# Patient Record
Sex: Female | Born: 1965 | Race: White | Hispanic: No | Marital: Married | Smoking: Never smoker
Health system: Southern US, Community
[De-identification: ages and names within clinical notes are randomized; demographics above are authoritative.]

## PROBLEM LIST (undated history)

## (undated) DIAGNOSIS — H04123 Dry eye syndrome of bilateral lacrimal glands: Secondary | ICD-10-CM

## (undated) DIAGNOSIS — R51 Headache: Secondary | ICD-10-CM

## (undated) DIAGNOSIS — Z973 Presence of spectacles and contact lenses: Secondary | ICD-10-CM

## (undated) DIAGNOSIS — Z8669 Personal history of other diseases of the nervous system and sense organs: Secondary | ICD-10-CM

## (undated) DIAGNOSIS — L719 Rosacea, unspecified: Secondary | ICD-10-CM

## (undated) HISTORY — PX: AUGMENTATION MAMMAPLASTY: SUR837

## (undated) HISTORY — PX: ABDOMINOPLASTY: SHX5355

## (undated) HISTORY — PX: ABDOMINAL HYSTERECTOMY: SHX81

## (undated) HISTORY — PX: OTHER SURGICAL HISTORY: SHX169

## (undated) HISTORY — PX: TOTAL ABDOMINAL HYSTERECTOMY: SHX209

## (undated) HISTORY — PX: CHOLECYSTECTOMY: SHX55

## (undated) HISTORY — PX: BREAST BIOPSY: SHX20

## (undated) HISTORY — PX: BREAST ENHANCEMENT SURGERY: SHX7

## (undated) HISTORY — PX: LAPAROSCOPIC CHOLECYSTECTOMY: SUR755

## (undated) HISTORY — PX: REDUCTION MAMMAPLASTY: SUR839

---

## 2002-04-22 ENCOUNTER — Encounter: Payer: Self-pay | Admitting: Obstetrics and Gynecology

## 2002-04-22 ENCOUNTER — Ambulatory Visit (HOSPITAL_COMMUNITY): Admission: RE | Admit: 2002-04-22 | Discharge: 2002-04-22 | Payer: Self-pay | Admitting: Obstetrics and Gynecology

## 2002-06-01 ENCOUNTER — Encounter (INDEPENDENT_AMBULATORY_CARE_PROVIDER_SITE_OTHER): Payer: Self-pay | Admitting: Specialist

## 2002-06-01 ENCOUNTER — Inpatient Hospital Stay (HOSPITAL_COMMUNITY): Admission: RE | Admit: 2002-06-01 | Discharge: 2002-06-03 | Payer: Self-pay | Admitting: Obstetrics and Gynecology

## 2003-02-22 ENCOUNTER — Other Ambulatory Visit: Admission: RE | Admit: 2003-02-22 | Discharge: 2003-02-22 | Payer: Self-pay | Admitting: Obstetrics and Gynecology

## 2004-03-26 ENCOUNTER — Encounter: Admission: RE | Admit: 2004-03-26 | Discharge: 2004-03-26 | Payer: Self-pay | Admitting: Obstetrics and Gynecology

## 2004-04-02 ENCOUNTER — Other Ambulatory Visit: Admission: RE | Admit: 2004-04-02 | Discharge: 2004-04-02 | Payer: Self-pay | Admitting: Obstetrics and Gynecology

## 2004-11-18 HISTORY — PX: AUGMENTATION MAMMAPLASTY: SUR837

## 2008-03-10 ENCOUNTER — Other Ambulatory Visit: Admission: RE | Admit: 2008-03-10 | Discharge: 2008-03-10 | Payer: Self-pay | Admitting: Obstetrics and Gynecology

## 2008-03-22 ENCOUNTER — Encounter: Admission: RE | Admit: 2008-03-22 | Discharge: 2008-03-22 | Payer: Self-pay | Admitting: Obstetrics and Gynecology

## 2008-04-05 ENCOUNTER — Encounter: Admission: RE | Admit: 2008-04-05 | Discharge: 2008-04-05 | Payer: Self-pay | Admitting: Obstetrics and Gynecology

## 2010-12-09 ENCOUNTER — Encounter: Payer: Self-pay | Admitting: Obstetrics and Gynecology

## 2011-04-05 NOTE — H&P (Signed)
[facility]  Patient:    Judy Chase, Judy Chase Visit Number: [id number] MRN: [id number]          Service Type: Attending:  Beather Arbour. Thomasena Edis, M.D. Dictated by:   Beather Arbour Thomasena Edis, M.D. Adm. 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  She has been seeing Dr. Gilford Silvius and was scheduled to have a hysterectomy, but her insurance will not cover this to be done at [facility].  She has had continuing right lower quadrant pain and had been found to have a complex left ovarian cyst. She had been scheduled for surgery by Dr. Gilford Silvius due to her significant menorrhagia.  Her cycles are every 30 days with a 7- to 10-day duration of flow.  She passes numerous clots such that she must change her pad every two hours.  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  She has often found that she soils her clothes extensive as she is unable to change her sanitary protection every one to two hours.  She has been tried on some norethindrone, but this caused nausea and she discontinued this.  I subsequently obtained Dr. Robina Ade records and obtained and ultrasound from [facility].  The patient is also complaining of persistent right lower quadrant pain as well. The right ovary was noted to be normal as was the left ovary.  The uterus was normal measuring 10 x 8 x 4.2 x 6.3 mm.  The endometrial stripe was normal measuring 7.5 mm.  A scar was seen along the lower uterine segment of the uterus consistent with her prior Cesarean section.  The patient was advised of the risks of surgery including anesthetic complication, hemorrhage infection, damage to adjacent structures including bladder, bowel, blood vessels  or ureter.  We will do a bowel prep.  I have also explained that she has increased risk to have damage to adjacent structures due to her previous surgery.  She absolutely refuses to take oral contraceptives stating that this would only temporize the issue as she does not want to take these forever and she is only 45 years old and will probably not become menopausal for 15-20 years.  PAST OBSTETRIC/GYNECOLOGIC HISTORY:  Menarche at age 29.  Contraception with tubal ligation.  The patient does have a history of an abnormal Pap smear years ago, but her most recent Pap smears have been normal.  PAST MEDICAL HISTORY: 1. Migraine headaches. 2. Menorrhagia. 3. The patient says she occasionally can have a low blood sugar, but does not    have diabetes or any glucose intolerance.  ALLERGIES:  No known drug allergies.  MEDICATIONS:  None.  PAST SURGICAL HISTORY: 1. Cesarean section for twins on October 19, 1984. 2. Laparoscopic cholecystectomy in April 1992. 3. Tubal ligation on August 13, 1990. 4. Spontaneous vaginal delivery x2 with one in 1982, and another in 1991.  FAMILY HISTORY:  There is no family history of colon, breast or prostate cancer.  The patients maternal first cousin had ovarian cancer.  The patients mother is 49, alive and well.  She did undergo a  hysterectomy.  Her father is 38 with adult-onset diabetes mellitus.  One sister, age 88, with cervical cancer.  Another sister, age 25, with rheumatoid arthritis.  Four children ages 61 to 32, alive and well.  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 Tobacco none.  Alcohol none.  REVIEW OF SYSTEMS:  Noncontributory except as noted above.  Denies headache, visual changes, chest pain, shortness of breath, abdominal pain, change in bowel habits, unintentional weight loss, dysuria, urgency, frequency, vaginal pruritus or discharge, pain or bleeding with intercourse.  PHYSICAL  EXAMINATION:  GENERAL:  Well-developed, Caucasian female with blood pressure 108/70, heart rate 72, height 5 feet 7 inches, weight 148 pounds.  HEENT:  Normal.  NECK:  Supple without thyromegaly, adenopathy or nodules.  CHEST:  Clear to auscultation.  BREASTS:  Symmetrical without masses, nodes, dimpling, retraction or nipple discharge.  CARDIAC:  Regular rate and rhythm without extra sounds or murmurs.  ABDOMEN:  Soft, nontender, no hepatosplenomegaly or masses.  I am able to palpate the uterus approximately 2-3 cm above the pubic symphysis.  EXTREMITIES:  No clubbing, cyanosis, or edema.  NEUROLOGIC:  Alert and oriented x3.  Grossly normal.  PELVIC:  Normal external female genitalia.  No vulvar, vaginal or cervical lesions.  Pap smear was not performed at her initial visit because it was normal at Dr. Robina Ade office on December 17, 2001.  The uterus is nontender. There is no adnexal masses palpated to be approximately 14 weeks size, but this was due to the elevation of the uterus up into the pelvis.  Rectal confirmatory with no masses.  ASSESSMENT/PLAN:  The patient is a 45 year old female with significant menorrhagia such that she bleeds 7-10 days each month.  Thyroid-stimulating hormone is normal and hemoglobin is normal as well.  Her pelvic ultrasound is normal.  I suspect the patient likely as adenomyosis.  She was tried on a trial of norethindrone, but was unable to tolerate this due to nausea.  She desires definitive treatment despite the risks.  She states that she does not desire to take oral contraceptives as her husband has had a vasectomy and this would only temporize the issue as she will not become menopausal for 15-20 years and does not desire to take oral contraceptives for that length of time. I suspect that she may have adenomyosis.  She is having a lot of pain in her right lower quadrant and our agreement is that she may possibly undergo a right  salpingo-oophorectomy if the ovary and tube are adhesed which could be  the etiology of the pain.  She desires to keep her left ovary if possible, but if this looks diseased in any way, she does not mind if this is removed as well.  She does understand that if she has a BSO, she will become instantly menopausal and require hormone replacement therapy.  Again, I have discussed all the risks as noted above as well as the risk of vesicovaginal fistula. The patient expresses understanding of and accepts these risks and desires to proceed with surgery. Dictated by:   Beather Arbour Thomasena Edis, M.D. Attending:  Beather Arbour. Thomasena Edis, M.D. DD:  05/31/02 TD:  05/31/02 Job: [id number]

## 2011-04-05 NOTE — Op Note (Signed)
[facility]  Patient:    Judy Chase, Judy Chase Visit Number: [id number] MRN: [id number]          Service Type: GYN Location: 4W [id number] Attending Physician:  Madelyn Flavors Dictated by:   Beather Arbour Thomasena Edis, M.D. Proc. Date: 06/01/02 Admit Date:  06/01/2002 Discharge Date: 06/03/2002   CC:         Georgina Peer, M.D.   Operative Report  PREOPERATIVE DIAGNOSES:  Significant metrorrhagia and right adnexal pain.  POSTOPERATIVE DIAGNOSES:  Significant metrorrhagia and right adnexal pain.  PROCEDURE:  Total abdominal hysterectomy, right salpingo-oophorectomy, lysis of fascial adhesions.  SURGEON:  Beather Arbour. Thomasena Edis, M.D.  ASSISTANT:  Georgina Peer, M.D.  ANESTHESIA:  General endotracheal.  ESTIMATED BLOOD LOSS:  Less than 100 cc.  DRAINS:  Colon Foley.  FLUIDS:  2300 cc of crystalloid and 1 g of cefotetan IV preoperative antibiotic. Urine output 350 cc.  COMPLICATIONS:  None.  DRAINS:  Foley.  FINDINGS:  Normal appendix, right ovarian corpus luteum, no adhesions of right ovary or tube, normal left tube and ovary. Thick fascial adhesions caused by probable Tevdek or other similar permanent suture. No clear etiology found for the patients right lower quadrant pain.  DESCRIPTION OF PROCEDURE:  The patient was brought to the operating room, identified on the operating room table. After induction of adequate general endotracheal anesthesia, the patient was placed in the supine position and prepped and draped in the usual sterile fashion fashion, a Foley catheter was placed. The patients previous cesarean section scar was noted to be just above the symphysis pubis but it was decided that we could probably easily perform the hysterectomy through this existing incision. An incision was made through the previous incision and carried down to the fascia. The fascia was noted to have very dense thick adhesions and we encountered many sutures  of some type of permanent suture such as Tevdek. It was necessary to go ahead and remove all these sutures. They were quite imbedded in the patients fascia. It was thought if we did remove them that they would create a nidus of infection and also maybe might compromise the fascial closure. After a very tedious dissection through the fascia and taking down the fascia in the midline, the patient was placed in Trendelenburg and the peritoneum was entered very carefully taking care to avoid bowel or other abdominal contents. This was done sharply with a knife. The muscles were noted to be quite adhesed in the midline and I was able to take them down using a scalpel very carefully. Great care was taken to not compromise the bladder. The peritoneal surfaces were noted to be smooth. The OConnor-OSullivan retractor was placed and the bowel was packed out of the operative field after the bladder bladder was placed, the abdominal wall retractor was placed. The uterus was elevated up to the level of the uterine incision. The left tube and ovary were noted to be normal. The right tube and ovary were noted essentially normal but there was noted to be a quite sizeable luteum of the right ovary. However, because this is the area of pain which the patient encountered, we had decided unless it looked absolutely normal to go ahead and remove the right tube and ovary. Long Kellys were placed at either angle of the uterus and the round ligament on the right was suture ligated with figure-of-eight suture of #0 monocryl, divided with cautery and the anterior leaf of the broad ligaments divided to  the area overlying the internal os. The clear space was developed and the infundibulopelvic ligament was clamped with a curved Heaney, cut, tied with a free tie suture ligature of #0 Monocryl. The ureter had been on the identified on the right and was noted to be well away from this pedicle. The bladder was noted to be  very adhesed in the midline and was taken down using sharp dissection. The left round ligament was divided with cautery and the right edge was tied with a free tie of #0 Monocryl. The anterior leaf of the broad ligament was separated to the area overlying the internal os. The bladder was further taken down and I was ultimately able to find an excellent plain and we were able to take down the bladder well; however, this was a very careful and tedious dissection especially in the midline. The uterine vessels on the right were subsequently skeletonized, clamped with a curved Heaney and an additional Heaney for back bleeding, cut and suture ligated using a figure-of-eight suture of #0 Monocryl. This was accomplished on the left in a similar fashion. It should be noted that after taking down the round ligament on the left, the utero-ovarian ligament was clamped with a curved Heaney, cut, and tied with a free tie and then a suture ligature of #0 Monocryl thus preserving the left tube and ovary. After the bladder was well taken down, subsequent cardinal uterosacral ligaments bites were taken with straight Heaneys, cut and suture ligated using sutures of #0 Monocryl. Using curved Heaneys, the uterosacral ligaments were clamped bilaterally, cut and suture ligated using sutures of #0 Monocryl. Again these bites were noted to be well away from the ureter. Using a curved Heaney, the distalmost portion of the uterosacral ligament complex was clamped, cut and suture ligated using a figure-of-eight suture of #0 Monocryl as the vagina was entered with this bite. A curved Heaney was placed across the distal portion of the vagina on the left was cut and the vagina was entered. A figure-of-eight suture of #0 Monocryl was placed in the midline to completely close the vagina as the vagina had been almost completely closed with the angled sutures placed when then vagina was entered. The vaginal cuff was noted to  be completely closed and there was noted to be no oozing. A  suture of #0 Vicryl was used to plicate the uterosacral ligaments to provide support. The pelvis was then entirely irrigated copiously with normal saline filled with warm lactated Ringers and excellent hemostasis was achieved using cautery. There was noted to be one oozing edge across the left vaginal cuff and a figure-of-eight suture of #0 Monocryl was placed with excellent hemostasis noted. Again a very careful and thorough systematic inspection of all pedicles was conducted and there was noted to be no bleeding. All pedicles were noted to be hemostatic. There was noted to be a small amount of oozing over the bladder flap and excellent hemostasis achieved using cautery. After noting excellent hemostasis, the OConnor-OSullivan retractor as well as the abdominal wall and bladder retractors were removed and all packs were removed from the operative field. The patient was taken out of Trendelenburg and Kelly clamps were placed on the peritoneum. The peritoneum was closed using a simple running suture of 2-0 monocryl. The subfascial areas were examined after evidence of hemostasis and excellent hemostasis achieved using cautery. The subcutaneous tissue was noted to be extremely scarred and in fact Scarpas fascia had been taken down previously to mobilize the  fascia so that the fascia could be closed without significant tension due to the significant scarring of the fascia. Very careful fascial closure was then performed using two sutures of #0 PDS with each suture anchored to the angles and around to the midline and tied in a simple running fashion. Excellent fascial closure was assured. The fascia was noted to be much more mobile with mobilization of the Scarpas layer. The subcutaneous tissue was then irrigated copiously with warm lactated Ringers and excellent hemostasis achieved using cautery. The skin was closed with staples. The  patient tolerated the procedure well without apparent complications and was transferred to the recovery room in stable condition after all instrument, sponge, and needle counts were correct. Dictated by:   Beather Arbour Thomasena Edis, M.D. Attending Physician:  Madelyn Flavors DD:  06/01/02 TD:  06/04/02 Job: [id number]

## 2011-07-08 ENCOUNTER — Other Ambulatory Visit: Payer: Self-pay | Admitting: Obstetrics and Gynecology

## 2011-07-08 ENCOUNTER — Other Ambulatory Visit: Payer: Self-pay | Admitting: Obstetrics & Gynecology

## 2011-07-08 DIAGNOSIS — Z1231 Encounter for screening mammogram for malignant neoplasm of breast: Secondary | ICD-10-CM

## 2011-07-19 ENCOUNTER — Ambulatory Visit
Admission: RE | Admit: 2011-07-19 | Discharge: 2011-07-19 | Disposition: A | Discharge status: Home / Self Care | Payer: 59 | Source: Ambulatory Visit | Attending: Obstetrics & Gynecology | Admitting: Obstetrics & Gynecology

## 2011-07-19 DIAGNOSIS — Z1231 Encounter for screening mammogram for malignant neoplasm of breast: Secondary | ICD-10-CM

## 2013-04-16 ENCOUNTER — Other Ambulatory Visit: Payer: Self-pay

## 2013-04-16 DIAGNOSIS — Z1231 Encounter for screening mammogram for malignant neoplasm of breast: Secondary | ICD-10-CM

## 2013-05-14 ENCOUNTER — Ambulatory Visit
Admission: RE | Admit: 2013-05-14 | Discharge: 2013-05-14 | Disposition: A | Discharge status: Home / Self Care | Payer: 59 | Source: Ambulatory Visit

## 2013-05-14 DIAGNOSIS — Z1231 Encounter for screening mammogram for malignant neoplasm of breast: Secondary | ICD-10-CM

## 2013-05-18 ENCOUNTER — Other Ambulatory Visit: Payer: Self-pay | Admitting: Obstetrics & Gynecology

## 2013-05-18 DIAGNOSIS — R928 Other abnormal and inconclusive findings on diagnostic imaging of breast: Secondary | ICD-10-CM

## 2013-05-20 ENCOUNTER — Encounter (HOSPITAL_COMMUNITY): Payer: Self-pay | Admitting: Pharmacy Technician

## 2013-05-28 NOTE — Patient Instructions (Addendum)
HAWLEY PAVIA  05/28/2013   Your procedure is scheduled on:  06/04/13  Report to [facility] at 06:15 AM.  Call this number if you have problems the morning of surgery: [phone]   Remember:   Do not eat food or drink liquids after midnight.   Take these medicines the morning of surgery with A SIP OF WATER: None   Do not wear jewelry, make-up or nail polish.  Do not wear lotions, powders, or perfumes.   Do not shave 48 hours prior to surgery. Men may shave face and neck.  Do not bring valuables to the hospital.  [facility] is not responsible for any belongings or valuables.  Contacts, dentures or bridgework may not be worn into surgery.  Leave suitcase in the car. After surgery it may be brought to your room.  For patients admitted to the hospital, checkout time is 11:00 AM the day of discharge.   Patients discharged the day of surgery will not be allowed to drive home.    Special Instructions: Shower using CHG 2 nights before surgery and the night before surgery.  If you shower the day of surgery use CHG.  Use special wash - you have one bottle of CHG for all showers.  You should use approximately 1/3 of the bottle for each shower.   Please read over the following fact sheets that you were given: Pain Booklet, Surgical Site Infection Prevention, Anesthesia Post-op Instructions and Care and Recovery After Surgery    Hemorrhoidectomy Hemorrhoidectomy is surgery to remove hemorrhoids. Hemorrhoids are veins that have become swollen in the rectum. The rectum is the area from the bottom end of the intestines to the opening where bowel movements leave the body. Hemorrhoids can be uncomfortable. They can cause itching, bleeding and pain if a blood clot forms in them (thrombose). If hemorrhoids are small, surgery may not be needed. But if they cover a larger area, surgery is usually suggested.  LET YOUR CAREGIVER KNOW ABOUT:   Any allergies.  All medications you are taking,  including:  Herbs, eyedrops, over-the-counter medications and creams.  Blood thinners (anticoagulants), aspirin or other drugs that could affect blood clotting.  Use of steroids (by mouth or as creams).  Previous problems with anesthetics, including local anesthetics.  Possibility of pregnancy, if this applies.  Any history of blood clots.  Any history of bleeding or other blood problems.  Previous surgery.  Smoking history.  Other health problems. RISKS AND COMPLICATIONS All surgery carries some risk. However, hemorrhoid surgery usually goes smoothly. Possible complications could include:  Urinary retention.  Bleeding.  Infection.  A painful incision.  A reaction to the anesthesia (this is not common). BEFORE THE PROCEDURE   Stop using aspirin and non-steroidal anti-inflammatory drugs (NSAIDs) for pain relief. This includes prescription drugs and over-the-counter drugs such as ibuprofen and naproxen. Also stop taking vitamin E. If possible, do this two weeks before your surgery.  If you take blood-thinners, ask your healthcare provider when you should stop taking them.  You will probably have blood and urine tests done several days before your surgery.  Do not eat or drink for about 8 hours before the surgery.  Arrive at least an hour before the surgery, or whenever your surgeon recommends. This will give you time to check in and fill out any needed paperwork.  Hemorrhoidectomy is often an outpatient procedure. This means you will be able to go home the same day. Sometimes, though, people stay overnight in the  hospital after the procedure. Ask your surgeon what to expect. Either way, make arrangements in advance for someone to drive you home. PROCEDURE   The preparation:  You will change into a hospital gown.  You will be given an IV. A needle will be inserted in your arm. Medication can flow directly into your body through this needle.  You might be given an  enema to clear your rectum.  Once in the operating room, you will probably lie on your side or be repositioned later to lying on your stomach.  You will be given anesthesia (medication) so you will not feel anything during the surgery. The surgery often is done with local anesthesia (the area near the hemorrhoids will be numb and you will be drowsy but awake). Sometimes, general anesthesia is used (you will be asleep during the procedure).  The procedure:  There are a few different procedures for hemorrhoids. Be sure to ask you surgeon about the procedure, the risks and benefits.  Be sure to ask about what you need to do to take care of the wound, if there is one. AFTER THE PROCEDURE  You will stay in a recovery area until the anesthesia has worn off. Your blood pressure and pulse will be checked every so often.  You may feel a lot of pain in the area of the rectum.  Take all pain medication prescribed by your surgeon. Ask before taking any over-the-counter pain medicines.  Sometimes sitting in a warm bath can help relieve your pain.  To make sure you have bowel movements without straining:  You will probably need to take stool softeners (usually a pill) for a few days.  You should drink 8 to 10 glasses of water each day.  Your activity will be restricted for awhile. Ask your caregiver for a list of what you should and should not do while you recover. Document Released: 09/01/2009 Document Revised: 01/27/2012 Document Reviewed: 09/01/2009 [facility] Patient Information 2014 [place], [place].    PATIENT INSTRUCTIONS POST-ANESTHESIA  IMMEDIATELY FOLLOWING SURGERY:  Do not drive or operate machinery for the first twenty four hours after surgery.  Do not make any important decisions for twenty four hours after surgery or while taking narcotic pain medications or sedatives.  If you develop intractable nausea and vomiting or a severe headache please notify your doctor  immediately.  FOLLOW-UP:  Please make an appointment with your surgeon as instructed. You do not need to follow up with anesthesia unless specifically instructed to do so.  WOUND CARE INSTRUCTIONS (if applicable):  Keep a dry clean dressing on the anesthesia/puncture wound site if there is drainage.  Once the wound has quit draining you may leave it open to air.  Generally you should leave the bandage intact for twenty four hours unless there is drainage.  If the epidural site drains for more than 36-48 hours please call the anesthesia department.  QUESTIONS?:  Please feel free to call your physician or the hospital operator if you have any questions, and they will be happy to assist you.

## 2013-05-31 ENCOUNTER — Encounter (HOSPITAL_COMMUNITY)
Admission: RE | Admit: 2013-05-31 | Discharge: 2013-05-31 | Disposition: A | Discharge status: Home / Self Care | Payer: 59 | Source: Ambulatory Visit | Attending: General Surgery | Admitting: General Surgery

## 2013-05-31 ENCOUNTER — Encounter (HOSPITAL_COMMUNITY): Payer: Self-pay

## 2013-05-31 HISTORY — DX: Headache: R51

## 2013-05-31 LAB — BASIC METABOLIC PANEL
CO2: 27 mEq/L (ref 19–32)
Calcium: 9.7 mg/dL (ref 8.4–10.5)
Creatinine, Ser: 0.89 mg/dL (ref 0.50–1.10)
Glucose, Bld: 113 mg/dL — ABNORMAL HIGH (ref 70–99)

## 2013-05-31 LAB — CBC WITH DIFFERENTIAL/PLATELET
Basophils Absolute: 0 10*3/uL (ref 0.0–0.1)
Eosinophils Absolute: 0.1 10*3/uL (ref 0.0–0.7)
Eosinophils Relative: 1 % (ref 0–5)
MCH: 30 pg (ref 26.0–34.0)
MCHC: 33.1 g/dL (ref 30.0–36.0)
MCV: 90.8 fL (ref 78.0–100.0)
Platelets: 268 10*3/uL (ref 150–400)
RDW: 14 % (ref 11.5–15.5)
WBC: 8.1 10*3/uL (ref 4.0–10.5)

## 2013-05-31 NOTE — H&P (Signed)
  NTS SOAP Note  Vital Signs:  Vitals as of: 05/18/2013: Systolic 145: Diastolic 78: Heart Rate 62: Temp 98.72F: Height 40ft 6.5in: Weight 149Lbs 0 Ounces: BMI 23.69  BMI : 23.69 kg/m2  Subjective: This 38 Years 60 Months old Female presents for of  increasing perirectal pain and intermittent bleeding. Patient states she's had a long-standing history of hemorrhoids. Over the last 20 years she's been able to control her symptoms with Preparation H. Over the last several months the discomfort has increased. She now states that they are frequently prolapsed and has to manually reduce them. She has not trialed any steroid creams otherwise she has been on stool softeners, fiber, water, sitz baths. No history of colonoscopy although she will undergo this next year. States mostly has discomfort with occasional intermittent bleeding with the flareups to  Review of Symptoms:  Constitutional:unremarkable   Head:unremarkable    Eyes:unremarkable   Nose/Mouth/Throat:unremarkable Cardiovascular:  unremarkable   Respiratory:unremarkable        as per history of present illness Genitourinary:unremarkable     Musculoskeletal:unremarkable   Skin:unremarkable Breast:unremarkable   Hematolgic/Lymphatic:unremarkable     Allergic/Immunologic:unremarkable     Past Medical History:  Obtained     Past Medical History  Pregnancy Gravida: 3 Pregnancy Para: 3 Surgical History: C-section, cholecystectomy, abdominoplasty, bilateral breast implants Medical Problems: none Psychiatric History: known Allergies: no known drug allergies Medications: none   Social History:Obtained  Social History  Preferred Language: English Race:  White Ethnicity: Not Hispanic / Latino Age: 47 Years 3 Months Marital Status:  S Alcohol: occasional Recreational drug(s): none   Smoking Status: Never smoker reviewed on 05/19/2013 Functional Status reviewed on  mm/dd/yyyy ------------------------------------------------ Bathing: Normal Cooking: Normal Dressing: Normal Driving: Normal Eating: Normal Managing Meds: Normal Oral Care: Normal Shopping: Normal Toileting: Normal Transferring: Normal Walking: Normal Cognitive Status reviewed on mm/dd/yyyy ------------------------------------------------ Attention: Normal Decision Making: Normal Language: Normal Memory: Normal Motor: Normal Perception: Normal Problem Solving: Normal Visual and Spatial: Normal   Family History:Obtained    Family Health History Mother  Father  Other Family Member, Living; Healthy; diabetes    Objective Information: General:  Well appearing, well nourished in no distress. Skin:     no rash or prominent lesions Head:Atraumatic; no masses; no abnormalities Eyes:  conjunctiva clear, EOM intact, PERRL Mouth:  Mucous membranes moist, no mucosal lesions. Neck:  Supple without lymphadenopathy.  Heart:  RRR, no murmur Lungs:    CTA bilaterally, no wheezes, rhonchi, rales.  Breathing unlabored. Abdomen:Soft, NT/ND, no HSM, no masses.     2 prominent hemorrhoidal columns. No bleeding. Minimal discomfort.  Assessment:    Plan:  Prolapsing hemorrhoids. Options were discussed at length the patient. At this time she is failing on multiple conservative treatment options. Patient does wish to proceed and we'll schedule her convenience.  Patient Education:Alternative treatments to surgery were discussed with patient (and family).  Risks and benefits  of procedure were fully explained to the patient (and family) who gave informed consent. Patient/family questions were addressed.  Follow-up:Pending Surgery

## 2013-06-04 ENCOUNTER — Ambulatory Visit (HOSPITAL_COMMUNITY): Payer: 59 | Admitting: Anesthesiology

## 2013-06-04 ENCOUNTER — Encounter (HOSPITAL_COMMUNITY): Payer: Self-pay | Admitting: *Deleted

## 2013-06-04 ENCOUNTER — Ambulatory Visit (HOSPITAL_COMMUNITY)
Admission: RE | Admit: 2013-06-04 | Discharge: 2013-06-04 | Disposition: A | Discharge status: Home / Self Care | Payer: 59 | Source: Ambulatory Visit | Attending: General Surgery | Admitting: General Surgery

## 2013-06-04 ENCOUNTER — Encounter (HOSPITAL_COMMUNITY): Payer: Self-pay | Admitting: Anesthesiology

## 2013-06-04 ENCOUNTER — Encounter (HOSPITAL_COMMUNITY)
Admission: RE | Disposition: A | Discharge status: Home / Self Care | Payer: Self-pay | Source: Ambulatory Visit | Attending: General Surgery

## 2013-06-04 DIAGNOSIS — K648 Other hemorrhoids: Secondary | ICD-10-CM | POA: Insufficient documentation

## 2013-06-04 HISTORY — PX: HEMORRHOID SURGERY: SHX153

## 2013-06-04 SURGERY — HEMORRHOIDECTOMY
Anesthesia: General | Wound class: Clean Contaminated

## 2013-06-04 MED ORDER — LIDOCAINE VISCOUS 2 % MT SOLN
OROMUCOSAL | Status: DC | PRN
  Administered 2013-06-04: 15 mL

## 2013-06-04 MED ORDER — MIDAZOLAM HCL 5 MG/5ML IJ SOLN
INTRAMUSCULAR | Status: DC | PRN
  Administered 2013-06-04: 2 mg via INTRAVENOUS

## 2013-06-04 MED ORDER — CELECOXIB 100 MG PO CAPS
ORAL_CAPSULE | ORAL | Status: AC
  Filled 2013-06-04: qty 4

## 2013-06-04 MED ORDER — ONDANSETRON HCL 4 MG/2ML IJ SOLN
4.0000 mg | Freq: Once | INTRAMUSCULAR | Status: AC
  Administered 2013-06-04: 4 mg via INTRAVENOUS

## 2013-06-04 MED ORDER — BUPIVACAINE HCL (PF) 0.5 % IJ SOLN
INTRAMUSCULAR | Status: AC
  Filled 2013-06-04: qty 30

## 2013-06-04 MED ORDER — PROPOFOL 10 MG/ML IV EMUL
INTRAVENOUS | Status: AC
  Filled 2013-06-04: qty 20

## 2013-06-04 MED ORDER — PROPOFOL 10 MG/ML IV BOLUS
INTRAVENOUS | Status: DC | PRN
  Administered 2013-06-04: 150 mg via INTRAVENOUS

## 2013-06-04 MED ORDER — LIDOCAINE HCL (PF) 1 % IJ SOLN
INTRAMUSCULAR | Status: AC
  Filled 2013-06-04: qty 5

## 2013-06-04 MED ORDER — ONDANSETRON HCL 4 MG/2ML IJ SOLN: 4.0000 mg | Freq: Once | INTRAMUSCULAR | Status: DC | PRN

## 2013-06-04 MED ORDER — LACTATED RINGERS IV SOLN
INTRAVENOUS | Status: DC
  Administered 2013-06-04: 1000 mL via INTRAVENOUS

## 2013-06-04 MED ORDER — BUPIVACAINE HCL (PF) 0.5 % IJ SOLN
INTRAMUSCULAR | Status: DC | PRN
  Administered 2013-06-04: 10 mL

## 2013-06-04 MED ORDER — FENTANYL CITRATE 0.05 MG/ML IJ SOLN
25.0000 ug | INTRAMUSCULAR | Status: AC
  Administered 2013-06-04: 25 ug via INTRAVENOUS

## 2013-06-04 MED ORDER — FENTANYL CITRATE 0.05 MG/ML IJ SOLN
INTRAMUSCULAR | Status: DC | PRN
  Administered 2013-06-04: 75 ug via INTRAVENOUS
  Administered 2013-06-04: 25 ug via INTRAVENOUS
  Administered 2013-06-04: 50 ug via INTRAVENOUS
  Administered 2013-06-04: 25 ug via INTRAVENOUS

## 2013-06-04 MED ORDER — FENTANYL CITRATE 0.05 MG/ML IJ SOLN
INTRAMUSCULAR | Status: AC
  Filled 2013-06-04: qty 2

## 2013-06-04 MED ORDER — ONDANSETRON HCL 4 MG/2ML IJ SOLN
INTRAMUSCULAR | Status: AC
  Filled 2013-06-04: qty 2

## 2013-06-04 MED ORDER — 0.9 % SODIUM CHLORIDE (POUR BTL) OPTIME
TOPICAL | Status: DC | PRN
  Administered 2013-06-04: 1000 mL

## 2013-06-04 MED ORDER — MIDAZOLAM HCL 2 MG/2ML IJ SOLN
1.0000 mg | INTRAMUSCULAR | Status: DC | PRN
  Administered 2013-06-04: 2 mg via INTRAVENOUS

## 2013-06-04 MED ORDER — FENTANYL CITRATE 0.05 MG/ML IJ SOLN: 25.0000 ug | INTRAMUSCULAR | Status: DC | PRN

## 2013-06-04 MED ORDER — HYDROCODONE-ACETAMINOPHEN 5-325 MG PO TABS: 1.0000 | ORAL_TABLET | ORAL | Status: DC | PRN

## 2013-06-04 MED ORDER — DEXTROSE 5 % IV SOLN
2.0000 g | INTRAVENOUS | Status: DC | PRN
  Administered 2013-06-04: 2 g via INTRAVENOUS

## 2013-06-04 MED ORDER — CELECOXIB 100 MG PO CAPS
400.0000 mg | ORAL_CAPSULE | Freq: Every day | ORAL | Status: AC
  Administered 2013-06-04: 400 mg via ORAL

## 2013-06-04 MED ORDER — DEXTROSE 5 % IV SOLN
INTRAVENOUS | Status: AC
  Filled 2013-06-04: qty 2

## 2013-06-04 MED ORDER — MIDAZOLAM HCL 2 MG/2ML IJ SOLN
INTRAMUSCULAR | Status: AC
  Filled 2013-06-04: qty 2

## 2013-06-04 MED ORDER — LIDOCAINE VISCOUS 2 % MT SOLN
OROMUCOSAL | Status: AC
  Filled 2013-06-04: qty 15

## 2013-06-04 MED ORDER — SURGILUBE EX GEL
CUTANEOUS | Status: DC | PRN
  Administered 2013-06-04: 1 via TOPICAL

## 2013-06-04 MED ORDER — DEXTROSE 5 % IV SOLN: 2.0000 g | INTRAVENOUS | Status: DC

## 2013-06-04 MED ORDER — LIDOCAINE HCL 1 % IJ SOLN
INTRAMUSCULAR | Status: DC | PRN
  Administered 2013-06-04: 50 mg via INTRADERMAL

## 2013-06-04 SURGICAL SUPPLY — 30 items
BAG HAMPER (MISCELLANEOUS) ×2 IMPLANT
CLOTH BEACON ORANGE TIMEOUT ST (SAFETY) ×2 IMPLANT
COVER LIGHT HANDLE STERIS (MISCELLANEOUS) ×4 IMPLANT
COVER MAYO STAND XLG (DRAPE) ×2 IMPLANT
DECANTER SPIKE VIAL GLASS SM (MISCELLANEOUS) ×2 IMPLANT
DRAPE PROXIMA HALF (DRAPES) ×2 IMPLANT
ELECT REM PT RETURN 9FT ADLT (ELECTROSURGICAL) ×2
ELECTRODE REM PT RTRN 9FT ADLT (ELECTROSURGICAL) ×1 IMPLANT
FORMALIN 10 PREFIL 120ML (MISCELLANEOUS) ×2 IMPLANT
GLOVE BIOGEL PI IND STRL 7.0 (GLOVE) ×3 IMPLANT
GLOVE BIOGEL PI IND STRL 7.5 (GLOVE) ×1 IMPLANT
GLOVE BIOGEL PI INDICATOR 7.0 (GLOVE) ×3
GLOVE BIOGEL PI INDICATOR 7.5 (GLOVE) ×1
GLOVE ECLIPSE 7.0 STRL STRAW (GLOVE) ×2 IMPLANT
GLOVE SS BIOGEL STRL SZ 6.5 (GLOVE) ×1 IMPLANT
GLOVE SUPERSENSE BIOGEL SZ 6.5 (GLOVE) ×1
GOWN STRL REIN XL XLG (GOWN DISPOSABLE) ×4 IMPLANT
HEMOSTAT SURGICEL 4X8 (HEMOSTASIS) ×2 IMPLANT
KIT ROOM TURNOVER APOR (KITS) ×2 IMPLANT
MANIFOLD NEPTUNE II (INSTRUMENTS) ×2 IMPLANT
NEEDLE HYPO 25X1 1.5 SAFETY (NEEDLE) ×2 IMPLANT
NS IRRIG 1000ML POUR BTL (IV SOLUTION) ×2 IMPLANT
PACK PERI GYN (CUSTOM PROCEDURE TRAY) ×2 IMPLANT
PAD ARMBOARD 7.5X6 YLW CONV (MISCELLANEOUS) ×2 IMPLANT
SET BASIN LINEN APH (SET/KITS/TRAYS/PACK) ×2 IMPLANT
SPONGE GAUZE 4X4 12PLY (GAUZE/BANDAGES/DRESSINGS) ×2 IMPLANT
SUT CHROMIC 3 0 SH 27 (SUTURE) ×2 IMPLANT
SUT SILK 0 FSL (SUTURE) ×2 IMPLANT
SYR CONTROL 10ML LL (SYRINGE) ×2 IMPLANT
TAPE CLOTH SURG 4X10 WHT LF (GAUZE/BANDAGES/DRESSINGS) ×2 IMPLANT

## 2013-06-04 NOTE — Transfer of Care (Signed)
Immediate Anesthesia Transfer of Care Note  Patient: Judy Chase  Procedure(s) Performed: Procedure(s): HEMORRHOIDECTOMY (N/A)  Patient Location: PACU  Anesthesia Type:General  Level of Consciousness: awake and patient cooperative  Airway & Oxygen Therapy: Patient Spontanous Breathing and Patient connected to face mask oxygen  Post-op Assessment: Report given to PACU RN, Post -op Vital signs reviewed and stable and Patient moving all extremities  Post vital signs: Reviewed and stable  Complications: No apparent anesthesia complications

## 2013-06-04 NOTE — Interval H&P Note (Signed)
History and Physical Interval Note:  06/04/2013 7:46 AM  Judy Chase  has presented today for surgery, with the diagnosis of hemorrhoid  The various methods of treatment have been discussed with the patient and family. After consideration of risks, benefits and other options for treatment, the patient has consented to  Procedure(s): HEMORRHOIDECTOMY (N/A) as a surgical intervention .  The patient's history has been reviewed, patient examined, no change in status, stable for surgery.  I have reviewed the patient's chart and labs.  Questions were answered to the patient's satisfaction.     Atzin Buchta C

## 2013-06-04 NOTE — Anesthesia Procedure Notes (Signed)
Procedure Name: LMA Insertion Date/Time: 06/04/2013 7:56 AM Performed by: Despina Hidden Pre-anesthesia Checklist: Emergency Drugs available, Patient identified, Suction available and Patient being monitored Patient Re-evaluated:Patient Re-evaluated prior to inductionOxygen Delivery Method: Circle system utilized Preoxygenation: Pre-oxygenation with 100% oxygen Ventilation: Mask ventilation without difficulty LMA: LMA inserted LMA Size: 3.0 Grade View: Grade I Number of attempts: 1 Placement Confirmation: ETT inserted through vocal cords under direct vision,  breath sounds checked- equal and bilateral and positive ETCO2 Tube secured with: Tape Dental Injury: Teeth and Oropharynx as per pre-operative assessment

## 2013-06-04 NOTE — Anesthesia Preprocedure Evaluation (Signed)
Anesthesia Evaluation  Patient identified by MRN, date of birth, ID band Patient awake    Reviewed: Allergy & Precautions, H&P , NPO status , Patient's Chart, lab work & pertinent test results  History of Anesthesia Complications Negative for: history of anesthetic complications  Airway Mallampati: II      Dental  (+) Teeth Intact   Pulmonary neg pulmonary ROS,  breath sounds clear to auscultation        Cardiovascular negative cardio ROS  Rhythm:Regular Rate:Normal     Neuro/Psych  Headaches,    GI/Hepatic negative GI ROS,   Endo/Other    Renal/GU      Musculoskeletal   Abdominal   Peds  Hematology   Anesthesia Other Findings   Reproductive/Obstetrics                           Anesthesia Physical Anesthesia Plan  ASA: II  Anesthesia Plan: General   Post-op Pain Management:    Induction: Intravenous  Airway Management Planned: LMA  Additional Equipment:   Intra-op Plan:   Post-operative Plan: Extubation in OR  Informed Consent: I have reviewed the patients History and Physical, chart, labs and discussed the procedure including the risks, benefits and alternatives for the proposed anesthesia with the patient or authorized representative who has indicated his/her understanding and acceptance.     Plan Discussed with:   Anesthesia Plan Comments:         Anesthesia Quick Evaluation

## 2013-06-04 NOTE — Anesthesia Postprocedure Evaluation (Signed)
  Anesthesia Post-op Note  Patient: Judy Chase  Procedure(s) Performed: Procedure(s): HEMORRHOIDECTOMY (N/A)  Patient Location: PACU  Anesthesia Type:General  Level of Consciousness: awake, alert , oriented and patient cooperative  Airway and Oxygen Therapy: Patient Spontanous Breathing  Post-op Pain: 2 /10, mild  Post-op Assessment: Post-op Vital signs reviewed, Patient's Cardiovascular Status Stable, Respiratory Function Stable, Patent Airway, No signs of Nausea or vomiting and Pain level controlled  Post-op Vital Signs: Reviewed and stable  Complications: No apparent anesthesia complications

## 2013-06-04 NOTE — Op Note (Signed)
Patient:  Judy Chase  DOB:  1966/04/21  MRN:  [id number]   Preop Diagnosis:  Prolapse hemorrhoid  Postop Diagnosis:  The same  Procedure:  Hemorrhoidectomy  Surgeon:  Dr. Tilford Pillar  Anes:  General endotracheal, 0.5% Sensorcaine plain for local  Indications:  Patient is a 47 year old female presented to my office with a history of medically refractory prolapsing hemorrhoids. These have been increasing in size and discomfort over the last several years. It is noted she has 2 prominent columns. Risks benefits alternatives of hemorrhoidectomy are discussed at length with patient. Risk including but not limited to risk of bleeding, infection, mucosal dehiscence, pain have all been discussed with patient. Her questions and concerns are addressed. Patient consented for the planned procedure.  Procedure note:  Patient is taken to the operating room was placed in supine position the or table time the general anesthetic is a Optician, dispensing. Once patient was asleep she is in endotracheally intubated by the nurse anesthetist. At this point patient's placed into bilateral yellow fin stirrups into a high lithotomy position. Her perineum is prepped with Betadine solution as was the rectum. Drapes are placed in standard fashion. Time out was performed. At this point digital rectal exam was performed with out any evidence of any masses or abnormal findings. The anal retractors inserted the nurse noted to be 2 prominent columns of prolapsing hemorrhoids. These are located at the 12:00 and 8:00 in a supine position. As the anterior column was the more prominent ice didn't initiate with this dissection first. Electrocautery was utilized to wedge the hemorrhoidal column and mucosa. Hemostasis was obtained with a combination of electrocautery and closure once closure of the mucosa was initiated. Once the hemorrhoidal tissue was free it was placed in the back table sent as a permanent specimen to pathology. Mucosal edges  were reapproximated using a 3-0 chromic in a running locking fashion. At this point the mucosa edges were well approximated and hemostasis is excellent. I then repositioned the retractor and focused on the 8:00 hemorrhoid. This again was similarly wedged using the electrocautery. The hemorrhoidal tissue was sent as a permanent specimen to pathology. The mucosal edges were reapproximated using the 3:00 in running locking fashion again. At this point as quite pleased with the appearance of the closures as well as hemostasis. Local anesthetic was instilled. A tampon created from rolled Surgicel was created with a 2-0 silk placed for withdrawal of the tampon. This was placed and viscous lidocaine was inserted transanally to help with continued hemostasis and pain control. At this point the surrounding skin was washed dried moist dry towel. 4 x 4 gauze dressings were rolled and placed up the anal opening to further secure the tampon in position. The gauze was temporarily held in position using a Medipore tape and the drapes removed. The patient was removed from lithotomy position was placed back into the supine position. She was allowed to come out of general anesthetic was extubated and was transferred to the PACU in stable condition. At the conclusion of procedure all instrument, sponge, needle counts are correct. Patient tolerated procedure extremely well.  Complications:  None apparent  EBL:  Less than 50 ML  Specimen:  Hemorrhoidal tissue x2

## 2013-06-08 ENCOUNTER — Encounter (HOSPITAL_COMMUNITY): Payer: Self-pay | Admitting: General Surgery

## 2013-06-14 ENCOUNTER — Other Ambulatory Visit: Payer: Self-pay | Admitting: Obstetrics & Gynecology

## 2013-06-14 ENCOUNTER — Ambulatory Visit
Admission: RE | Admit: 2013-06-14 | Discharge: 2013-06-14 | Disposition: A | Discharge status: Home / Self Care | Payer: 59 | Source: Ambulatory Visit | Attending: Obstetrics & Gynecology | Admitting: Obstetrics & Gynecology

## 2013-06-14 DIAGNOSIS — R928 Other abnormal and inconclusive findings on diagnostic imaging of breast: Secondary | ICD-10-CM

## 2013-06-14 DIAGNOSIS — R921 Mammographic calcification found on diagnostic imaging of breast: Secondary | ICD-10-CM

## 2013-06-29 ENCOUNTER — Ambulatory Visit
Admission: RE | Admit: 2013-06-29 | Discharge: 2013-06-29 | Disposition: A | Discharge status: Home / Self Care | Payer: 59 | Source: Ambulatory Visit | Attending: Obstetrics & Gynecology | Admitting: Obstetrics & Gynecology

## 2013-06-29 ENCOUNTER — Other Ambulatory Visit (HOSPITAL_COMMUNITY): Payer: Self-pay | Admitting: Diagnostic Radiology

## 2013-06-29 DIAGNOSIS — R921 Mammographic calcification found on diagnostic imaging of breast: Secondary | ICD-10-CM

## 2013-09-08 ENCOUNTER — Other Ambulatory Visit: Payer: 59

## 2013-09-08 DIAGNOSIS — Z1329 Encounter for screening for other suspected endocrine disorder: Secondary | ICD-10-CM

## 2013-09-08 DIAGNOSIS — Z13220 Encounter for screening for lipoid disorders: Secondary | ICD-10-CM

## 2013-09-08 DIAGNOSIS — Z0000 Encounter for general adult medical examination without abnormal findings: Secondary | ICD-10-CM

## 2013-09-08 LAB — CBC
HCT: 38.8 % (ref 36.0–46.0)
MCHC: 34.3 g/dL (ref 30.0–36.0)
Platelets: 235 10*3/uL (ref 150–400)
RDW: 14.2 % (ref 11.5–15.5)
WBC: 6.6 10*3/uL (ref 4.0–10.5)

## 2013-09-08 LAB — COMPREHENSIVE METABOLIC PANEL
ALT: 10 U/L (ref 0–35)
AST: 34 U/L (ref 0–37)
Albumin: 4.2 g/dL (ref 3.5–5.2)
Alkaline Phosphatase: 42 U/L (ref 39–117)
BUN: 15 mg/dL (ref 6–23)
Calcium: 9.6 mg/dL (ref 8.4–10.5)
Chloride: 106 mEq/L (ref 96–112)
Potassium: 4.8 mEq/L (ref 3.5–5.3)
Sodium: 140 mEq/L (ref 135–145)

## 2013-09-08 LAB — LIPID PANEL
LDL Cholesterol: 110 mg/dL — ABNORMAL HIGH (ref 0–99)
Triglycerides: 95 mg/dL (ref <150)
VLDL: 19 mg/dL (ref 0–40)

## 2013-09-16 ENCOUNTER — Encounter: Payer: Self-pay | Admitting: Obstetrics & Gynecology

## 2013-09-16 ENCOUNTER — Ambulatory Visit (INDEPENDENT_AMBULATORY_CARE_PROVIDER_SITE_OTHER): Payer: 59 | Admitting: Obstetrics & Gynecology

## 2013-09-16 VITALS — BP 100/60 | Ht 67.4 in | Wt 148.0 lb

## 2013-09-16 DIAGNOSIS — G43909 Migraine, unspecified, not intractable, without status migrainosus: Secondary | ICD-10-CM

## 2013-09-16 DIAGNOSIS — Z01419 Encounter for gynecological examination (general) (routine) without abnormal findings: Secondary | ICD-10-CM

## 2013-09-16 NOTE — Progress Notes (Signed)
Patient ID: Judy Chase, female   DOB: 02-Nov-1966, 47 y.o.   MRN: [id number] Subjective:     Judy Chase is a 47 y.o. female here for a routine exam.  No LMP recorded. Patient has had a hysterectomy. No obstetric history on file. Current complaints: none.  .   Gynecologic History No LMP recorded. Patient has had a hysterectomy. Contraception: status post hysterectomy Last Pap: 2011. Results were: normal Last mammogram: 2014. Results were: abnormal  Past Medical History  Diagnosis Date  . Headache(784.0)     migraine-last one about 2 weeks ago    Past Surgical History  Procedure Laterality Date  . Abdominal hysterectomy    . Cesarean section    . Cholecystectomy    . Breast enhancement surgery    . Abdominoplasty    . Hemorrhoid surgery N/A 06/04/2013    Procedure: HEMORRHOIDECTOMY;  Surgeon: Fabio Bering, MD;  Location: [facility];  Service: General;  Laterality: N/A;  . Breast biopsy      OB History   Grav Para Term Preterm Abortions TAB SAB Ect Mult Living                  History   Social History  . Marital Status: Married    Spouse Name: N/A    Number of Children: N/A  . Years of Education: N/A   Social History Main Topics  . Smoking status: Never Smoker   . Smokeless tobacco: None  . Alcohol Use: Yes     Comment: occassional  . Drug Use: No  . Sexual Activity: Yes    Birth Control/ Protection: Surgical   Other Topics Concern  . None   Social History Narrative  . None    Family History  Problem Relation Age of Onset  . Diabetes Father   . Arthritis Sister   . Lung cancer Brother   . Mental illness Brother   . Diverticulosis Mother      Review of Systems  Review of Systems  Constitutional: Negative for fever, chills, weight loss, malaise/fatigue and diaphoresis.  HENT: Negative for hearing loss, ear pain, nosebleeds, congestion, sore throat, neck pain, tinnitus and ear discharge.   Eyes: Negative for blurred vision, double vision,  photophobia, pain, discharge and redness.  Respiratory: Negative for cough, hemoptysis, sputum production, shortness of breath, wheezing and stridor.   Cardiovascular: Negative for chest pain, palpitations, orthopnea, claudication, leg swelling and PND.  Gastrointestinal: negative for abdominal pain. Negative for heartburn, nausea, vomiting, diarrhea, constipation, blood in stool and melena.  Genitourinary: Negative for dysuria, urgency, frequency, hematuria and flank pain.  Musculoskeletal: Negative for myalgias, back pain, joint pain and falls.  Skin: Negative for itching and rash.  Neurological: Negative for dizziness, tingling, tremors, sensory change, speech change, focal weakness, seizures, loss of consciousness, weakness and headaches.  Endo/Heme/Allergies: Negative for environmental allergies and polydipsia. Does not bruise/bleed easily.  Psychiatric/Behavioral: Negative for depression, suicidal ideas, hallucinations, memory loss and substance abuse. The patient is not nervous/anxious and does not have insomnia.        Objective:    Physical Exam  Vitals reviewed. Constitutional: She is oriented to person, place, and time. She appears well-developed and well-nourished.  HENT:  Head: Normocephalic and atraumatic.        Right Ear: External ear normal.  Left Ear: External ear normal.  Nose: Nose normal.  Mouth/Throat: Oropharynx is clear and moist.  Eyes: Conjunctivae and EOM are normal. Pupils are equal, round, and reactive  to light. Right eye exhibits no discharge. Left eye exhibits no discharge. No scleral icterus.  Neck: Normal range of motion. Neck supple. No tracheal deviation present. No thyromegaly present.  Cardiovascular: Normal rate, regular rhythm, normal heart sounds and intact distal pulses.  Exam reveals no gallop and no friction rub.   No murmur heard. Respiratory: Effort normal and breath sounds normal. No respiratory distress. She has no wheezes. She has no rales.  She exhibits no tenderness.  GI: Soft. Bowel sounds are normal. She exhibits no distension and no mass. There is no tenderness. There is no rebound and no guarding.  Genitourinary:  Breasts no masses skin changes or nipple changes bilaterally      Vulva is normal without lesions Vagina is pink moist without discharge Cervix absent  Uterus absent Adnexa is surgically absent   Musculoskeletal: Normal range of motion. She exhibits no edema and no tenderness.  Neurological: She is alert and oriented to person, place, and time. She has normal reflexes. She displays normal reflexes. No cranial nerve deficit. She exhibits normal muscle tone. Coordination normal.  Skin: Skin is warm and dry. No rash noted. No erythema. No pallor.  Psychiatric: She has a normal mood and affect. Her behavior is normal. Judgment and thought content normal.       Assessment:    Healthy female exam.    Plan:    Mammogram ordered. Follow up in: 1 year.

## 2013-09-23 ENCOUNTER — Other Ambulatory Visit: Payer: Self-pay

## 2013-11-23 ENCOUNTER — Other Ambulatory Visit: Payer: Self-pay | Admitting: Obstetrics & Gynecology

## 2013-11-23 DIAGNOSIS — R921 Mammographic calcification found on diagnostic imaging of breast: Secondary | ICD-10-CM

## 2013-12-23 ENCOUNTER — Ambulatory Visit
Admission: RE | Admit: 2013-12-23 | Discharge: 2013-12-23 | Disposition: A | Discharge status: Home / Self Care | Payer: 59 | Source: Ambulatory Visit | Attending: Obstetrics & Gynecology | Admitting: Obstetrics & Gynecology

## 2013-12-23 DIAGNOSIS — R921 Mammographic calcification found on diagnostic imaging of breast: Secondary | ICD-10-CM

## 2014-02-11 ENCOUNTER — Encounter: Payer: Self-pay | Admitting: Obstetrics & Gynecology

## 2014-02-11 ENCOUNTER — Ambulatory Visit (INDEPENDENT_AMBULATORY_CARE_PROVIDER_SITE_OTHER): Payer: 59 | Admitting: Obstetrics & Gynecology

## 2014-02-11 VITALS — BP 104/80 | Ht 67.0 in | Wt 150.0 lb

## 2014-02-11 DIAGNOSIS — Z0000 Encounter for general adult medical examination without abnormal findings: Secondary | ICD-10-CM

## 2014-02-11 DIAGNOSIS — W19XXXA Unspecified fall, initial encounter: Secondary | ICD-10-CM

## 2014-02-11 MED ORDER — NAPROXEN SODIUM 550 MG PO TABS: 550.0000 mg | ORAL_TABLET | Freq: Two times a day (BID) | ORAL | Status: DC

## 2014-02-11 NOTE — Progress Notes (Signed)
Patient ID: Judy Chase, female   DOB: Feb 29, 1966, 48 y.o.   MRN: [id number] Larey SeatFell while skating

## 2014-03-18 ENCOUNTER — Encounter (HOSPITAL_COMMUNITY): Payer: Self-pay | Admitting: Emergency Medicine

## 2014-03-18 ENCOUNTER — Emergency Department (INDEPENDENT_AMBULATORY_CARE_PROVIDER_SITE_OTHER)
Admission: EM | Admit: 2014-03-18 | Discharge: 2014-03-18 | Disposition: A | Discharge status: Home / Self Care | Payer: 59 | Source: Home / Self Care | Attending: Family Medicine | Admitting: Family Medicine

## 2014-03-18 DIAGNOSIS — R509 Fever, unspecified: Secondary | ICD-10-CM

## 2014-03-18 LAB — POCT URINALYSIS DIP (DEVICE)
BILIRUBIN URINE: NEGATIVE
Glucose, UA: NEGATIVE mg/dL
Leukocytes, UA: NEGATIVE
NITRITE: NEGATIVE
PH: 5.5 (ref 5.0–8.0)
Protein, ur: NEGATIVE mg/dL
Urobilinogen, UA: 0.2 mg/dL (ref 0.0–1.0)

## 2014-03-18 LAB — CBC WITH DIFFERENTIAL/PLATELET
BASOS PCT: 0 % (ref 0–1)
Basophils Absolute: 0 10*3/uL (ref 0.0–0.1)
EOS ABS: 0.4 10*3/uL (ref 0.0–0.7)
EOS PCT: 6 % — AB (ref 0–5)
HCT: 39.2 % (ref 36.0–46.0)
HEMOGLOBIN: 12.9 g/dL (ref 12.0–15.0)
Lymphocytes Relative: 12 % (ref 12–46)
Lymphs Abs: 0.9 10*3/uL (ref 0.7–4.0)
MCH: 30.3 pg (ref 26.0–34.0)
MCHC: 32.9 g/dL (ref 30.0–36.0)
MCV: 92 fL (ref 78.0–100.0)
MONO ABS: 0.5 10*3/uL (ref 0.1–1.0)
MONOS PCT: 7 % (ref 3–12)
NEUTROS PCT: 75 % (ref 43–77)
Neutro Abs: 5.4 10*3/uL (ref 1.7–7.7)
Platelets: 176 10*3/uL (ref 150–400)
RBC: 4.26 MIL/uL (ref 3.87–5.11)
RDW: 14 % (ref 11.5–15.5)
WBC: 7.2 10*3/uL (ref 4.0–10.5)

## 2014-03-18 LAB — BASIC METABOLIC PANEL
BUN: 17 mg/dL (ref 6–23)
CALCIUM: 8.9 mg/dL (ref 8.4–10.5)
CHLORIDE: 102 meq/L (ref 96–112)
CO2: 25 meq/L (ref 19–32)
CREATININE: 0.8 mg/dL (ref 0.50–1.10)
GFR calc Af Amer: >90 mL/min (ref >90)
GFR calc non Af Amer: 85 mL/min — ABNORMAL LOW (ref >90)
GLUCOSE: 95 mg/dL (ref 70–99)
Potassium: 3.9 mEq/L (ref 3.7–5.3)
Sodium: 140 mEq/L (ref 137–147)

## 2014-03-18 LAB — SEDIMENTATION RATE: Sed Rate: 8 mm/hr (ref 0–22)

## 2014-03-18 LAB — CK: Total CK: 68 U/L (ref 7–177)

## 2014-03-18 NOTE — ED Notes (Signed)
C/o fever, chills, body aches x 2 days, pain into flank today. Concern for UTI

## 2014-03-18 NOTE — ED Provider Notes (Signed)
CSN: [id number]     Arrival date & time 03/18/14  1629 History   First MD Initiated Contact with Patient 03/18/14 1727     Chief Complaint  Patient presents with  . Fever   (Consider location/radiation/quality/duration/timing/severity/associated sxs/prior Treatment) HPI  Fever to 100.5 yesterday. Associated with sore muscles and fatigue. The patient also has developed a rash on her arms and legs today. It is composed of red bumps that are nontender. It is not involving her hands and soles or oral mucosa. She has not been outside in the woods. She does not have any dogs. She has not started any new medications, except naproxen last week. She has a history of rheumatologic disease. 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  Past Medical History  Diagnosis Date  . Headache(784.0)     migraine-last one about 2 weeks ago   Past Surgical History  Procedure Laterality Date  . Abdominal hysterectomy    . Cesarean section    . Cholecystectomy    . Breast enhancement surgery    . Abdominoplasty    . Hemorrhoid surgery N/A 06/04/2013    Procedure: HEMORRHOIDECTOMY;  Surgeon: Donato Heinz, MD;  Location: [facility];  Service: General;  Laterality: N/A;  . Breast biopsy     Family History  Problem Relation Age of Onset  . Diabetes Father   . Arthritis Sister   . Lung cancer Brother   . Mental illness Brother   . Diverticulosis Mother    History  Substance Use Topics  . Smoking status: Never Smoker   . Smokeless tobacco: Not on file  . Alcohol Use: Yes     Comment: occassional   OB History   Grav Para Term Preterm Abortions TAB SAB Ect Mult Living                 Review of Systems Negative for nausea, vomiting, diarrhea, vision changes, headache, chest pain, shortness of breath Allergies  Review of patient's allergies indicates no known allergies.  Home Medications   Prior to Admission medications   Medication Sig Start Date End Date  Taking? Authorizing Provider  HYDROcodone-acetaminophen (NORCO) 5-325 MG per tablet Take 1-2 tablets by mouth every 4 (four) hours as needed for pain. 06/04/13   Donato Heinz, MD  Multiple Vitamin (MULTIVITAMIN WITH MINERALS) TABS Take 1 tablet by mouth daily.    Historical Provider, MD  naproxen sodium (ANAPROX DS) 550 MG tablet Take 1 tablet (550 mg total) by mouth 2 (two) times daily with a meal. 02/11/14   Florian Buff, MD  rizatriptan (MAXALT-MLT) 10 MG disintegrating tablet Take 10 mg by mouth as needed for migraine. May repeat in 2 hours if needed    Historical Provider, MD   BP 144/79  Pulse 70  Temp(Src) 98.6 F (37 C) (Oral)  Resp 14  SpO2 98% Physical Exam Gen: middle aged female,ill appearing, NAD, pleasant and conversant HEENT: NCAT, PERRLA, EOMI, OP clear and moist, no oropharyngeal exudate, no lymphadenopathy, no thyroid tenderness, enlargement, or nodules, neck with normal ROM, no meningismus, TM reflective without effusion  CV: RRR, no m/r/g, no JVD or carotid bruits Pulm: normal WOB, CTA-B Abd: soft, NDNT, NABS Extremities: no edema or joint tenderness Skin: warm, dry, discrete red papules on upper extremities bilaterally, with red macular rashes around her knees bilaterally Neuro/Psych: A&Ox4, normal affect, speech, and thought content  ED Course  Procedures (including critical care time) Labs  Review Labs Reviewed  POCT URINALYSIS DIP (DEVICE) - Abnormal; Notable for the following:    Ketones, ur TRACE (*)    Hgb urine dipstick TRACE (*)    All other components within normal limits  CBC WITH DIFFERENTIAL  CK  SEDIMENTATION RATE  BASIC METABOLIC PANEL    Imaging Review No results found.   MDM   1. Febrile illness, acute    Broad differential including viral illness with myalgia and exanthem, tick borne illness or rheumatologic disease. At this point I favor viral illness with conservative management. Will obtain CBC with diff, CK, ESR, and BMP for  starting labs. F/u PRN.     Angelica Ran, MD 03/18/14 [phone]

## 2014-03-18 NOTE — Discharge Instructions (Signed)
Ms. Judy Chase,   We are going to draw some labs today to see if there is anything concerning. You are likely experiencing a viral illness causing fever, muscle aches, and rash. Therefore, please take ibuprofen. Follow up with your PCP on Monday, and I will let you know if there is any abnormal labs.   Sincerely,   Dr. Clinton SawyerWilliamson

## 2014-03-20 NOTE — ED Provider Notes (Signed)
Medical screening examination/treatment/procedure(s) were performed by a resident physician or non-physician practitioner and as the supervising physician I was immediately available for consultation/collaboration.  Ivelis Norgard, MD    Toma Arts S Kenyatte Gruber, MD 03/20/14 0842 

## 2014-10-26 ENCOUNTER — Other Ambulatory Visit: Payer: Self-pay | Admitting: Obstetrics & Gynecology

## 2014-10-26 DIAGNOSIS — R921 Mammographic calcification found on diagnostic imaging of breast: Secondary | ICD-10-CM

## 2014-11-24 ENCOUNTER — Ambulatory Visit
Admission: RE | Admit: 2014-11-24 | Discharge: 2014-11-24 | Disposition: A | Discharge status: Home / Self Care | Payer: 59 | Source: Ambulatory Visit | Attending: Obstetrics & Gynecology | Admitting: Obstetrics & Gynecology

## 2014-11-24 DIAGNOSIS — R921 Mammographic calcification found on diagnostic imaging of breast: Secondary | ICD-10-CM

## 2015-05-12 ENCOUNTER — Encounter: Payer: Self-pay | Admitting: Obstetrics & Gynecology

## 2015-05-12 ENCOUNTER — Ambulatory Visit (INDEPENDENT_AMBULATORY_CARE_PROVIDER_SITE_OTHER): Payer: Commercial Managed Care - HMO | Admitting: Obstetrics & Gynecology

## 2015-05-12 VITALS — BP 128/78 | HR 60 | Ht 68.0 in | Wt 159.0 lb

## 2015-05-12 DIAGNOSIS — Z1212 Encounter for screening for malignant neoplasm of rectum: Secondary | ICD-10-CM

## 2015-05-12 DIAGNOSIS — R102 Pelvic and perineal pain: Secondary | ICD-10-CM

## 2015-05-12 DIAGNOSIS — Z01419 Encounter for gynecological examination (general) (routine) without abnormal findings: Secondary | ICD-10-CM

## 2015-05-12 DIAGNOSIS — Z1211 Encounter for screening for malignant neoplasm of colon: Secondary | ICD-10-CM

## 2015-05-12 MED ORDER — TRIAMTERENE-HCTZ 37.5-25 MG PO TABS: 1.0000 | ORAL_TABLET | Freq: Every day | ORAL | Status: DC

## 2015-05-12 NOTE — Progress Notes (Signed)
Patient ID: Judy Chase, female   DOB: 10-Nov-1966, 49 y.o.   MRN: [id number] Subjective:     Judy Chase is a 49 y.o. female here for a routine exam.  No LMP recorded. Patient has had a hysterectomy. No obstetric history on file. Birth Control Method:  hysterectomy Menstrual Calendar(currently): na  Current complaints: pelvic pressure.   Current acute medical issues:     Recent Gynecologic History No LMP recorded. Patient has had a hysterectomy. Last Pap: na,   Last mammogram: 2016,  normal  Past Medical History  Diagnosis Date  . Headache(784.0)     migraine-last one about 2 weeks ago    Past Surgical History  Procedure Laterality Date  . Abdominal hysterectomy    . Cesarean section    . Cholecystectomy    . Breast enhancement surgery    . Abdominoplasty    . Hemorrhoid surgery N/A 06/04/2013    Procedure: HEMORRHOIDECTOMY;  Surgeon: Fabio Bering, MD;  Location: [facility];  Service: General;  Laterality: N/A;  . Breast biopsy      OB History    No data available      History   Social History  . Marital Status: Married    Spouse Name: N/A  . Number of Children: N/A  . Years of Education: N/A   Social History Main Topics  . Smoking status: Never Smoker   . Smokeless tobacco: Never Used  . Alcohol Use: 0.0 oz/week    0 Standard drinks or equivalent per week     Comment: occassional  . Drug Use: No  . Sexual Activity: Yes    Birth Control/ Protection: Surgical   Other Topics Concern  . None   Social History Narrative    Family History  Problem Relation Age of Onset  . Diabetes Father   . Arthritis Sister   . Lung cancer Brother   . Mental illness Brother   . Diverticulosis Mother      Current outpatient prescriptions:  Marland Kitchen  Multiple Vitamin (MULTIVITAMIN WITH MINERALS) TABS, Take 1 tablet by mouth daily., Disp: , Rfl:  .  Probiotic Product (PROBIOTIC DAILY PO), Take 1 capsule by mouth daily., Disp: , Rfl:  .  rizatriptan (MAXALT-MLT) 10 MG  disintegrating tablet, Take 10 mg by mouth as needed for migraine. May repeat in 2 hours if needed, Disp: , Rfl:  .  HYDROcodone-acetaminophen (NORCO) 5-325 MG per tablet, Take 1-2 tablets by mouth every 4 (four) hours as needed for pain. (Patient not taking: Reported on 05/12/2015), Disp: 45 tablet, Rfl: 0 .  naproxen sodium (ANAPROX DS) 550 MG tablet, Take 1 tablet (550 mg total) by mouth 2 (two) times daily with a meal. (Patient not taking: Reported on 05/12/2015), Disp: 60 tablet, Rfl: 1 .  triamterene-hydrochlorothiazide (MAXZIDE-25) 37.5-25 MG per tablet, Take 1 tablet by mouth daily., Disp: 30 tablet, Rfl: 3  Review of Systems  Review of Systems  Constitutional: Negative for fever, chills, weight loss, malaise/fatigue and diaphoresis.  HENT: Negative for hearing loss, ear pain, nosebleeds, congestion, sore throat, neck pain, tinnitus and ear discharge.   Eyes: Negative for blurred vision, double vision, photophobia, pain, discharge and redness.  Respiratory: Negative for cough, hemoptysis, sputum production, shortness of breath, wheezing and stridor.   Cardiovascular: Negative for chest pain, palpitations, orthopnea, claudication, leg swelling and PND.  Gastrointestinal: negative for abdominal pain. Negative for heartburn, nausea, vomiting, diarrhea, constipation, blood in stool and melena.  Genitourinary: Negative for dysuria, urgency, frequency, hematuria  and flank pain.  Musculoskeletal: Negative for myalgias, back pain, joint pain and falls.  Skin: Negative for itching and rash.  Neurological: Negative for dizziness, tingling, tremors, sensory change, speech change, focal weakness, seizures, loss of consciousness, weakness and headaches.  Endo/Heme/Allergies: Negative for environmental allergies and polydipsia. Does not bruise/bleed easily.  Psychiatric/Behavioral: Negative for depression, suicidal ideas, hallucinations, memory loss and substance abuse. The patient is not nervous/anxious  and does not have insomnia.        Objective:  Blood pressure 128/78, pulse 60, height  (1.727 m), weight 159 lb (72.122 kg).   Physical Exam  Vitals reviewed. Constitutional: She is oriented to person, place, and time. She appears well-developed and well-nourished.  HENT:  Head: Normocephalic and atraumatic.        Right Ear: External ear normal.  Left Ear: External ear normal.  Nose: Nose normal.  Mouth/Throat: Oropharynx is clear and moist.  Eyes: Conjunctivae and EOM are normal. Pupils are equal, round, and reactive to light. Right eye exhibits no discharge. Left eye exhibits no discharge. No scleral icterus.  Neck: Normal range of motion. Neck supple. No tracheal deviation present. No thyromegaly present.  Cardiovascular: Normal rate, regular rhythm, normal heart sounds and intact distal pulses.  Exam reveals no gallop and no friction rub.   No murmur heard. Respiratory: Effort normal and breath sounds normal. No respiratory distress. She has no wheezes. She has no rales. She exhibits no tenderness.  GI: Soft. Bowel sounds are normal. She exhibits no distension and no mass. There is no tenderness. There is no rebound and no guarding.  Genitourinary:  Breasts no masses skin changes or nipple changes bilaterally      Vulva is normal without lesions Vagina is pink moist without discharge, grade II cystocoele Cervix absent Uterus is absent Adnexa is negative  {Rectal    hemoccult negative, normal tone, no masses Musculoskeletal: Normal range of motion. She exhibits no edema and no tenderness.  Neurological: She is alert and oriented to person, place, and time. She has normal reflexes. She displays normal reflexes. No cranial nerve deficit. She exhibits normal muscle tone. Coordination normal.  Skin: Skin is warm and dry. No rash noted. No erythema. No pallor.  Psychiatric: She has a normal mood and affect. Her behavior is normal. Judgment and thought content normal.        Assessment:    Healthy female exam.    Plan:    Mammogram ordered. Follow up in: 2 weeks. sonogram

## 2015-05-29 ENCOUNTER — Other Ambulatory Visit: Payer: Commercial Managed Care - HMO

## 2015-05-30 ENCOUNTER — Ambulatory Visit (INDEPENDENT_AMBULATORY_CARE_PROVIDER_SITE_OTHER): Payer: Commercial Managed Care - HMO

## 2015-05-30 ENCOUNTER — Encounter: Payer: Self-pay | Admitting: Obstetrics & Gynecology

## 2015-05-30 ENCOUNTER — Ambulatory Visit (INDEPENDENT_AMBULATORY_CARE_PROVIDER_SITE_OTHER): Payer: Commercial Managed Care - HMO | Admitting: Obstetrics & Gynecology

## 2015-05-30 VITALS — BP 110/80 | HR 72 | Wt 154.4 lb

## 2015-05-30 DIAGNOSIS — R102 Pelvic and perineal pain: Secondary | ICD-10-CM

## 2015-05-30 NOTE — Progress Notes (Signed)
Patient ID: Judy Chase, female   DOB: 1966-07-31, 49 y.o.   MRN: [id number] Chief Complaint  Patient presents with  . Follow-up    ultrasound     Blood pressure 110/80, pulse 72, weight 154 lb 6.4 oz (70.035 kg).  [place] Transvaginal Non-ob  05/30/2015   GYNECOLOGIC SONOGRAM   Judy Chase is a 49 y.o.s/p hysterectomy and rt oophorectomy,is here for  a pelvic sonogram for LLQ pain and pelvic fullness.   Vag.cuff                  WNL    Right adnexa            WNL  Left ovary                4.4 x 2.9 x 2.75 cm,simple left ov cyst 2.7 x  2.2 x 2.8cm,    Technician Comments:  US PELVIS TV: vag cuff wnl,simple left ov cyst 2.7 x 2.2 x 2.8cm,rt adnexa  wnl,no free fluid,pain on lt     E. I. du Pontmber J Carl 05/30/2015 3:19 PM  Clinical Impression and recommendations:  I have reviewed the sonogram results above, combined with the patient's  current clinical course, below are my impressions and any appropriate  recommendations for management based on the sonographic findings.  Physiologic cyst left ovary Otherwise negative pelvic sonogram   Judy Chase H 05/30/2015 3:29 PM    Discussed results and also fluid retention    Face to face time:  15 minutes  Greater than 50% of the visit time was spent in counseling and coordination of care with the patient.  The summary and outline of the counseling and care coordination is summarized in the note above.   All questions were answered.

## 2015-05-30 NOTE — Progress Notes (Signed)
US PELVIS TV: vag cuff wnl,simple left ov cyst 2.7 x 2.2 x 2.8cm,rt adnexa wnl,no free fluid,pain on lt

## 2015-11-24 ENCOUNTER — Encounter (HOSPITAL_COMMUNITY): Payer: Self-pay | Admitting: Emergency Medicine

## 2015-11-24 ENCOUNTER — Emergency Department (INDEPENDENT_AMBULATORY_CARE_PROVIDER_SITE_OTHER)
Admission: EM | Admit: 2015-11-24 | Discharge: 2015-11-24 | Disposition: A | Discharge status: Home / Self Care | Payer: Commercial Managed Care - HMO | Source: Home / Self Care | Attending: Family Medicine | Admitting: Family Medicine

## 2015-11-24 DIAGNOSIS — N390 Urinary tract infection, site not specified: Secondary | ICD-10-CM | POA: Diagnosis not present

## 2015-11-24 LAB — POCT URINALYSIS DIP (DEVICE)
BILIRUBIN URINE: NEGATIVE
Glucose, UA: NEGATIVE mg/dL
NITRITE: POSITIVE — AB
PH: 6 (ref 5.0–8.0)
Protein, ur: NEGATIVE mg/dL
SPECIFIC GRAVITY, URINE: 1.02 (ref 1.005–1.030)
Urobilinogen, UA: 0.2 mg/dL (ref 0.0–1.0)

## 2015-11-24 MED ORDER — CEPHALEXIN 500 MG PO CAPS: 500.0000 mg | ORAL_CAPSULE | Freq: Four times a day (QID) | ORAL | Status: DC

## 2015-11-24 NOTE — ED Notes (Signed)
uti symptoms.  Patient denies a history of uti.  Reports on Monday 1/1 noted odor to urine.  Noticed pain after urinary stream ends

## 2015-11-24 NOTE — Discharge Instructions (Signed)
Take all of medicine as directed, drink lots of fluids, see your doctor if further problems. °

## 2015-11-24 NOTE — ED Provider Notes (Signed)
CSN: [id number]     Arrival date & time 11/24/15  1317 History   First MD Initiated Contact with Patient 11/24/15 1552     Chief Complaint  Patient presents with  . Urinary Tract Infection   (Consider location/radiation/quality/duration/timing/severity/associated sxs/prior Treatment) Patient is a 50 y.o. female presenting with dysuria. The history is provided by the patient.  Dysuria Pain quality:  Burning Pain severity:  Mild Onset quality:  Gradual Duration:  5 days Chronicity:  New Recent urinary tract infections: no   Relieved by:  None tried Ineffective treatments:  None tried Urinary symptoms: foul-smelling urine and frequent urination   Associated symptoms: no abdominal pain, no fever, no nausea, no vaginal discharge and no vomiting   Risk factors: no recurrent urinary tract infections     Past Medical History  Diagnosis Date  . Headache(784.0)     migraine-last one about 2 weeks ago   Past Surgical History  Procedure Laterality Date  . Abdominal hysterectomy    . Cesarean section    . Cholecystectomy    . Breast enhancement surgery    . Abdominoplasty    . Hemorrhoid surgery N/A 06/04/2013    Procedure: HEMORRHOIDECTOMY;  Surgeon: Fabio BeringBrent C Ziegler, MD;  Location: [facility];  Service: General;  Laterality: N/A;  . Breast biopsy     Family History  Problem Relation Age of Onset  . Diabetes Father   . Arthritis Sister   . Lung cancer Brother   . Mental illness Brother   . Diverticulosis Mother    Social History  Substance Use Topics  . Smoking status: Never Smoker   . Smokeless tobacco: Never Used  . Alcohol Use: 0.0 oz/week    0 Standard drinks or equivalent per week     Comment: occassional   OB History    No data available     Review of Systems  Constitutional: Negative.  Negative for fever and chills.  Gastrointestinal: Negative.  Negative for nausea, vomiting and abdominal pain.  Genitourinary: Positive for dysuria, urgency and frequency. Negative for  vaginal discharge.  All other systems reviewed and are negative.   Allergies  Naproxen  Home Medications   Prior to Admission medications   Medication Sig Start Date End Date Taking? Authorizing Provider  cephALEXin (KEFLEX) 500 MG capsule Take 1 capsule (500 mg total) by mouth 4 (four) times daily. Take all of medicine and drink lots of fluids 11/24/15   Linna HoffJames D Haya Hemler, MD  HYDROcodone-acetaminophen (NORCO) 5-325 MG per tablet Take 1-2 tablets by mouth every 4 (four) hours as needed for pain. Patient not taking: Reported on 05/12/2015 06/04/13   Tilford PillarBrent Ziegler, MD  Multiple Vitamin (MULTIVITAMIN WITH MINERALS) TABS Take 1 tablet by mouth daily.    Historical Provider, MD  naproxen sodium (ANAPROX DS) 550 MG tablet Take 1 tablet (550 mg total) by mouth 2 (two) times daily with a meal. Patient not taking: Reported on 05/12/2015 02/11/14   Lazaro ArmsLuther H Eure, MD  Probiotic Product (PROBIOTIC DAILY PO) Take 1 capsule by mouth daily.    Historical Provider, MD  rizatriptan (MAXALT-MLT) 10 MG disintegrating tablet Take 10 mg by mouth as needed for migraine. May repeat in 2 hours if needed    Historical Provider, MD  triamterene-hydrochlorothiazide (MAXZIDE-25) 37.5-25 MG per tablet Take 1 tablet by mouth daily. Patient not taking: Reported on 05/30/2015 05/12/15   Lazaro ArmsLuther H Eure, MD   Meds Ordered and Administered this Visit  Medications - No data to display  BP  158/91 mmHg  Pulse 60  Temp(Src) 98.2 F (36.8 C) (Oral)  Resp 16  SpO2 97% No data found.   Physical Exam  Constitutional: She is oriented to person, place, and time. She appears well-developed and well-nourished.  Abdominal: Soft. Normal appearance and bowel sounds are normal. She exhibits no distension and no mass. There is tenderness in the suprapubic area. There is no rigidity, no rebound, no guarding and no CVA tenderness.  Neurological: She is alert and oriented to person, place, and time.  Skin: Skin is warm and dry.  Nursing note  and vitals reviewed.   ED Course  Procedures (including critical care time)  Labs Review Labs Reviewed  POCT URINALYSIS DIP (DEVICE) - Abnormal; Notable for the following:    Ketones, ur TRACE (*)    Hgb urine dipstick MODERATE (*)    Nitrite POSITIVE (*)    Leukocytes, UA TRACE (*)    All other components within normal limits    Imaging Review No results found.   Visual Acuity Review  Right Eye Distance:   Left Eye Distance:   Bilateral Distance:    Right Eye Near:   Left Eye Near:    Bilateral Near:         MDM   1. UTI (lower urinary tract infection)        Linna Hoff, MD 11/24/15 [phone]

## 2016-02-12 ENCOUNTER — Other Ambulatory Visit: Payer: Self-pay | Admitting: Obstetrics & Gynecology

## 2016-02-12 DIAGNOSIS — R921 Mammographic calcification found on diagnostic imaging of breast: Secondary | ICD-10-CM

## 2016-02-26 ENCOUNTER — Ambulatory Visit
Admission: RE | Admit: 2016-02-26 | Discharge: 2016-02-26 | Disposition: A | Discharge status: Home / Self Care | Payer: Commercial Managed Care - HMO | Source: Ambulatory Visit | Attending: Obstetrics & Gynecology | Admitting: Obstetrics & Gynecology

## 2016-02-26 DIAGNOSIS — R921 Mammographic calcification found on diagnostic imaging of breast: Secondary | ICD-10-CM

## 2016-12-18 ENCOUNTER — Other Ambulatory Visit: Payer: Self-pay | Admitting: Obstetrics & Gynecology

## 2016-12-18 DIAGNOSIS — Z1231 Encounter for screening mammogram for malignant neoplasm of breast: Secondary | ICD-10-CM

## 2017-02-17 ENCOUNTER — Ambulatory Visit
Admission: RE | Admit: 2017-02-17 | Discharge: 2017-02-17 | Disposition: A | Discharge status: Home / Self Care | Payer: Commercial Managed Care - HMO | Source: Ambulatory Visit | Attending: Obstetrics & Gynecology | Admitting: Obstetrics & Gynecology

## 2017-02-17 DIAGNOSIS — Z1231 Encounter for screening mammogram for malignant neoplasm of breast: Secondary | ICD-10-CM

## 2017-07-31 ENCOUNTER — Ambulatory Visit (INDEPENDENT_AMBULATORY_CARE_PROVIDER_SITE_OTHER): Payer: 59 | Admitting: Obstetrics & Gynecology

## 2017-07-31 ENCOUNTER — Encounter: Payer: Self-pay | Admitting: Obstetrics & Gynecology

## 2017-07-31 VITALS — BP 110/68 | HR 63 | Ht 68.0 in | Wt 160.0 lb

## 2017-07-31 DIAGNOSIS — Z01419 Encounter for gynecological examination (general) (routine) without abnormal findings: Secondary | ICD-10-CM | POA: Diagnosis not present

## 2017-07-31 DIAGNOSIS — N951 Menopausal and female climacteric states: Secondary | ICD-10-CM

## 2017-07-31 DIAGNOSIS — Z1211 Encounter for screening for malignant neoplasm of colon: Secondary | ICD-10-CM | POA: Diagnosis not present

## 2017-07-31 DIAGNOSIS — Z1212 Encounter for screening for malignant neoplasm of rectum: Secondary | ICD-10-CM

## 2017-07-31 MED ORDER — ESTRADIOL 0.1 MG/24HR TD PTTW: 1.0000 | MEDICATED_PATCH | TRANSDERMAL | 12 refills | Status: DC

## 2017-07-31 NOTE — Progress Notes (Signed)
Subjective:     Judy Chase is a 51 y.o. female here for a routine exam.  No LMP recorded. Patient has had a hysterectomy. No obstetric history on file. Birth Control Method:  hysterectomy Menstrual Calendar(currently): n/a  Current complaints: menopausal symptoms for 1 year.   Current acute medical issues:  none   Recent Gynecologic History No LMP recorded. Patient has had a hysterectomy. Last Pap: pre hysterectomy,  No indication for Pap testing now Last mammogram: 02/2017,  normal  Past Medical History:  Diagnosis Date  . Headache(784.0)    migraine-last one about 2 weeks ago    Past Surgical History:  Procedure Laterality Date  . ABDOMINAL HYSTERECTOMY    . ABDOMINOPLASTY    . AUGMENTATION MAMMAPLASTY    . BREAST BIOPSY    . BREAST ENHANCEMENT SURGERY    . CESAREAN SECTION    . CHOLECYSTECTOMY    . HEMORRHOID SURGERY N/A 06/04/2013   Procedure: HEMORRHOIDECTOMY;  Surgeon: Fabio BeringBrent C Ziegler, MD;  Location: [facility];  Service: General;  Laterality: N/A;    OB History    No data available      Social History   Social History  . Marital status: Married    Spouse name: N/A  . Number of children: N/A  . Years of education: N/A   Social History Main Topics  . Smoking status: Never Smoker  . Smokeless tobacco: Never Used  . Alcohol use 0.0 oz/week     Comment: occassional  . Drug use: No  . Sexual activity: Yes    Birth control/ protection: Surgical   Other Topics Concern  . None   Social History Narrative  . None    Family History  Problem Relation Age of Onset  . Diabetes Father   . Arthritis Sister   . Lung cancer Brother   . Mental illness Brother   . Diverticulosis Mother      Current Outpatient Prescriptions:  .  Biotin 1 MG CAPS, Take by mouth., Disp: , Rfl:  .  Multiple Vitamin (MULTIVITAMIN WITH MINERALS) TABS, Take 1 tablet by mouth daily., Disp: , Rfl:  .  estradiol (VIVELLE-DOT) 0.1 MG/24HR patch, Place 1 patch (0.1 mg total) onto the  skin 2 (two) times a week., Disp: 8 patch, Rfl: 12  Review of Systems  Review of Systems  Constitutional: Negative for fever, chills, weight loss, malaise/fatigue and diaphoresis.  HENT: Negative for hearing loss, ear pain, nosebleeds, congestion, sore throat, neck pain, tinnitus and ear discharge.   Eyes: Negative for blurred vision, double vision, photophobia, pain, discharge and redness.  Respiratory: Negative for cough, hemoptysis, sputum production, shortness of breath, wheezing and stridor.   Cardiovascular: Negative for chest pain, palpitations, orthopnea, claudication, leg swelling and PND.  Gastrointestinal: negative for abdominal pain. Negative for heartburn, nausea, vomiting, diarrhea, constipation, blood in stool and melena.  Genitourinary: Negative for dysuria, urgency, frequency, hematuria and flank pain.  Musculoskeletal: Negative for myalgias, back pain, joint pain and falls.  Skin: Negative for itching and rash.  Neurological: Negative for dizziness, tingling, tremors, sensory change, speech change, focal weakness, seizures, loss of consciousness, weakness and headaches.  Endo/Heme/Allergies: Negative for environmental allergies and polydipsia. Does not bruise/bleed easily.  Psychiatric/Behavioral: Negative for depression, suicidal ideas, hallucinations, memory loss and substance abuse. The patient is not nervous/anxious and does not have insomnia.        Objective:  Blood pressure 110/68, pulse 63, height 5\' 8"  (1.727 m), weight 160 lb (72.6 kg).  Physical Exam  Vitals reviewed. Constitutional: She is oriented to person, place, and time. She appears well-developed and well-nourished.  HENT:  Head: Normocephalic and atraumatic.        Right Ear: External ear normal.  Left Ear: External ear normal.  Nose: Nose normal.  Mouth/Throat: Oropharynx is clear and moist.  Eyes: Conjunctivae and EOM are normal. Pupils are equal, round, and reactive to light. Right eye exhibits  no discharge. Left eye exhibits no discharge. No scleral icterus.  Neck: Normal range of motion. Neck supple. No tracheal deviation present. No thyromegaly present.  Cardiovascular: Normal rate, regular rhythm, normal heart sounds and intact distal pulses.  Exam reveals no gallop and no friction rub.   No murmur heard. Respiratory: Effort normal and breath sounds normal. No respiratory distress. She has no wheezes. She has no rales. She exhibits no tenderness.  GI: Soft. Bowel sounds are normal. She exhibits no distension and no mass. There is no tenderness. There is no rebound and no guarding.  Genitourinary:  Breasts no masses skin changes or nipple changes bilaterally      Vulva is normal without lesions Vagina is pink moist without discharge Cervix absent Uterus is absent Adnexa is negative  {Rectal    hemoccult negative, normal tone, no masses  Musculoskeletal: Normal range of motion. She exhibits no edema and no tenderness.  Neurological: She is alert and oriented to person, place, and time. She has normal reflexes. She displays normal reflexes. No cranial nerve deficit. She exhibits normal muscle tone. Coordination normal.  Skin: Skin is warm and dry. No rash noted. No erythema. No pallor.  Psychiatric: She has a normal mood and affect. Her behavior is normal. Judgment and thought content normal.       Medications Ordered at today's visit: Meds ordered this encounter  Medications  . Biotin 1 MG CAPS    Sig: Take by mouth.  . estradiol (VIVELLE-DOT) 0.1 MG/24HR patch    Sig: Place 1 patch (0.1 mg total) onto the skin 2 (two) times a week.    Dispense:  8 patch    Refill:  12    Other orders placed at today's visit: No orders of the defined types were placed in this encounter.     Assessment:    Healthy female exam.   menopausal symptoms Plan:    Hormone replacement therapy: Vivelle Dot 0.1 mg. Mammogram ordered. Follow up in: 1 year.     Return in about 1 year  (around 07/31/2018) for yearly, with Dr Despina Hidden.

## 2017-09-11 ENCOUNTER — Other Ambulatory Visit: Payer: Self-pay | Admitting: *Deleted

## 2017-09-12 MED ORDER — ESTRADIOL 0.1 MG/24HR TD PTTW: 1.0000 | MEDICATED_PATCH | TRANSDERMAL | 3 refills | Status: DC

## 2018-08-16 ENCOUNTER — Other Ambulatory Visit: Payer: Self-pay | Admitting: Obstetrics & Gynecology

## 2018-11-18 HISTORY — PX: AUGMENTATION MAMMAPLASTY: SUR837

## 2018-11-18 HISTORY — PX: BREAST ENHANCEMENT SURGERY: SHX7

## 2019-01-25 ENCOUNTER — Other Ambulatory Visit: Payer: Self-pay | Admitting: Obstetrics & Gynecology

## 2019-01-29 ENCOUNTER — Encounter: Payer: Self-pay | Admitting: Advanced Practice Midwife

## 2019-01-29 ENCOUNTER — Ambulatory Visit (INDEPENDENT_AMBULATORY_CARE_PROVIDER_SITE_OTHER): Payer: 59 | Admitting: Advanced Practice Midwife

## 2019-01-29 ENCOUNTER — Other Ambulatory Visit: Payer: Self-pay

## 2019-01-29 VITALS — BP 149/85 | HR 56 | Ht 68.0 in | Wt 161.0 lb

## 2019-01-29 DIAGNOSIS — Z1212 Encounter for screening for malignant neoplasm of rectum: Secondary | ICD-10-CM | POA: Diagnosis not present

## 2019-01-29 DIAGNOSIS — N63 Unspecified lump in unspecified breast: Secondary | ICD-10-CM

## 2019-01-29 DIAGNOSIS — Z01419 Encounter for gynecological examination (general) (routine) without abnormal findings: Secondary | ICD-10-CM

## 2019-01-29 DIAGNOSIS — Z1211 Encounter for screening for malignant neoplasm of colon: Secondary | ICD-10-CM | POA: Diagnosis not present

## 2019-01-29 MED ORDER — ESTRADIOL 0.1 MG/24HR TD PTTW: 1.0000 | MEDICATED_PATCH | TRANSDERMAL | 11 refills | Status: DC

## 2019-01-29 NOTE — Progress Notes (Signed)
Judy Chase 53 y.o.  Vitals:   01/29/19 0928  BP: (!) 149/85  Pulse: (!) 56     Filed Weights   01/29/19 0928  Weight: 161 lb (73 kg)    Past Medical History: Past Medical History:  Diagnosis Date  . Headache(784.0)    migraine-last one about 2 weeks ago    Past Surgical History: Past Surgical History:  Procedure Laterality Date  . ABDOMINAL HYSTERECTOMY    . ABDOMINOPLASTY    . AUGMENTATION MAMMAPLASTY    . BREAST BIOPSY    . BREAST ENHANCEMENT SURGERY    . CESAREAN SECTION    . CHOLECYSTECTOMY    . HEMORRHOID SURGERY N/A 06/04/2013   Procedure: HEMORRHOIDECTOMY;  Surgeon: Fabio Bering, MD;  Location: [facility];  Service: General;  Laterality: N/A;    Family History: Family History  Problem Relation Age of Onset  . Diabetes Father   . Arthritis Sister   . Lung cancer Brother   . Mental illness Brother   . Diverticulosis Mother     Social History: Social History   Tobacco Use  . Smoking status: Never Smoker  . Smokeless tobacco: Never Used  Substance Use Topics  . Alcohol use: Yes    Alcohol/week: 0.0 standard drinks    Comment: occassional  . Drug use: No    Allergies:  Allergies  Allergen Reactions  . Naproxen Swelling    Body aches      Current Outpatient Medications:  .  [START ON 02/01/2019] estradiol (VIVELLE-DOT) 0.1 MG/24HR patch, Place 1 patch (0.1 mg total) onto the skin 2 (two) times a week., Disp: 24 patch, Rfl: 11 .  Multiple Vitamin (MULTIVITAMIN WITH MINERALS) TABS, Take 1 tablet by mouth daily., Disp: , Rfl:  .  Biotin 1 MG CAPS, Take by mouth., Disp: , Rfl:   History of Present Illness: Here for physical.  Hyst/right oopherectomy. Started Vivelle dot patches last year, tried to wean off but can't d/t hot flashes. Found a breast lump a few weeks ago, nontender.    Review of Systems   Patient denies any headaches, blurred vision, shortness of breath, chest pain, abdominal pain, problems with bowel movements, urination, or  intercourse.   Physical Exam: General:  Well developed, well nourished, no acute distress Skin:  Warm and dry Neck:  Midline trachea, normal thyroid Lungs; Clear to auscultation bilaterally Breast:  No dominant palpable mass, retraction, or nipple discharge.  Left breast has pea sized nodule near nipple, soft, mobile. Implants.  Cardiovascular: Regular rate and rhythm Abdomen:  Soft, non tender, no hepatosplenomegaly Pelvic:  External genitalia is normal in appearance.  The vagina is normal in appearance.  No adnexal masses or tenderness noted. Right ovary surgically absent Extremities:  No swelling or varicosities noted Rectum:  Neg hemmocult.  Psych:  No mood changes.     Impression: normal gyn exam Breast nodule     Plan: mammogram/US scheduled for 3/20 Continue ERT

## 2019-02-05 ENCOUNTER — Ambulatory Visit
Admission: RE | Admit: 2019-02-05 | Discharge: 2019-02-05 | Disposition: A | Discharge status: Home / Self Care | Payer: 59 | Source: Ambulatory Visit | Attending: Advanced Practice Midwife | Admitting: Advanced Practice Midwife

## 2019-02-05 ENCOUNTER — Ambulatory Visit: Payer: 59

## 2019-02-05 ENCOUNTER — Other Ambulatory Visit: Payer: Self-pay

## 2019-02-05 DIAGNOSIS — N63 Unspecified lump in unspecified breast: Secondary | ICD-10-CM

## 2020-02-17 ENCOUNTER — Other Ambulatory Visit: Payer: Self-pay | Admitting: Advanced Practice Midwife

## 2020-08-26 ENCOUNTER — Ambulatory Visit
Admission: EM | Admit: 2020-08-26 | Discharge: 2020-08-26 | Disposition: A | Discharge status: Home / Self Care | Payer: 59 | Attending: Emergency Medicine | Admitting: Emergency Medicine

## 2020-08-26 ENCOUNTER — Other Ambulatory Visit: Payer: Self-pay

## 2020-08-26 ENCOUNTER — Encounter: Payer: Self-pay | Admitting: Emergency Medicine

## 2020-08-26 DIAGNOSIS — B029 Zoster without complications: Secondary | ICD-10-CM | POA: Diagnosis not present

## 2020-08-26 MED ORDER — PREDNISONE 10 MG PO TABS: 20.0000 mg | ORAL_TABLET | Freq: Every day | ORAL | 0 refills | Status: DC

## 2020-08-26 MED ORDER — VALACYCLOVIR HCL 1 G PO TABS: 1000.0000 mg | ORAL_TABLET | Freq: Three times a day (TID) | ORAL | 0 refills | Status: AC

## 2020-08-26 NOTE — ED Provider Notes (Addendum)
[facility]   [id number] 08/26/20 Arrival Time: 1231   Chief Complaint  Patient presents with  . Rash     SUBJECTIVE: History from: patient.  Judy Chase is a 54 y.o. female presented to the urgent care for complaint of rash to left temple for the past 5 days.  She denies changes in soaps, detergents, or anyone with similar symptoms.  She localizes the rash to her left ankle.  She describes it red, painful and burning.  She has tried OTC hydrocortisone without relief.  Denies aggravating factors. Denies similar symptoms in the past.  Denies chills, fever, nausea, vomiting, diarrhea  ROS: As per HPI.  All other pertinent ROS negative.     Past Medical History:  Diagnosis Date  . Headache(784.0)    migraine-last one about 2 weeks ago   Past Surgical History:  Procedure Laterality Date  . ABDOMINAL HYSTERECTOMY    . ABDOMINOPLASTY    . AUGMENTATION MAMMAPLASTY    . BREAST BIOPSY    . BREAST ENHANCEMENT SURGERY    . CESAREAN SECTION    . CHOLECYSTECTOMY    . HEMORRHOID SURGERY N/A 06/04/2013   Procedure: HEMORRHOIDECTOMY;  Surgeon: Fabio Bering, MD;  Location: [facility];  Service: General;  Laterality: N/A;  . REDUCTION MAMMAPLASTY Bilateral    breast lift   Allergies  Allergen Reactions  . Naproxen Swelling    Body aches   No current facility-administered medications on file prior to encounter.   Current Outpatient Medications on File Prior to Encounter  Medication Sig Dispense Refill  . Biotin 1 MG CAPS Take by mouth.    . estradiol (VIVELLE-DOT) 0.1 MG/24HR patch PLACE 1 PATCH (0.1 MG TOTAL) ONTO THE SKIN 2 (TWO) TIMES A WEEK. 24 patch 11  . Multiple Vitamin (MULTIVITAMIN WITH MINERALS) TABS Take 1 tablet by mouth daily.     Social History   Socioeconomic History  . Marital status: Married    Spouse name: Not on file  . Number of children: Not on file  . Years of education: Not on file  . Highest education level: Not on file  Occupational History    . Not on file  Tobacco Use  . Smoking status: Never Smoker  . Smokeless tobacco: Never Used  Substance and Sexual Activity  . Alcohol use: Yes    Alcohol/week: 0.0 standard drinks    Comment: occassional  . Drug use: No  . Sexual activity: Yes    Birth control/protection: Surgical  Other Topics Concern  . Not on file  Social History Narrative  . Not on file   Social Determinants of Health   Financial Resource Strain:   . Difficulty of Paying Living Expenses: Not on file  Food Insecurity:   . Worried About Programme researcher, broadcasting/film/video in the Last Year: Not on file  . Ran Out of Food in the Last Year: Not on file  Transportation Needs:   . Lack of Transportation (Medical): Not on file  . Lack of Transportation (Non-Medical): Not on file  Physical Activity:   . Days of Exercise per Week: Not on file  . Minutes of Exercise per Session: Not on file  Stress:   . Feeling of Stress : Not on file  Social Connections:   . Frequency of Communication with Friends and Family: Not on file  . Frequency of Social Gatherings with Friends and Family: Not on file  . Attends Religious Services: Not on file  . Active Member of  Clubs or Organizations: Not on file  . Attends Banker Meetings: Not on file  . Marital Status: Not on file  Intimate Partner Violence:   . Fear of Current or Ex-Partner: Not on file  . Emotionally Abused: Not on file  . Physically Abused: Not on file  . Sexually Abused: Not on file   Family History  Problem Relation Age of Onset  . Diabetes Father   . Arthritis Sister   . Lung cancer Brother   . Mental illness Brother   . Diverticulosis Mother     OBJECTIVE:  Vitals:   08/26/20 1243  BP: (!) 169/92  Pulse: 67  Resp: 17  Temp: (!) 97.4 F (36.3 C)  TempSrc: Oral  SpO2: 93%  Weight: 160 lb (72.6 kg)  Height: 5\' 8"  (1.727 m)     General appearance: alert; appears fatigued, but nontoxic; speaking in full sentences and tolerating own  secretions HEENT: NCAT; Ears: EACs clear, TMs pearly gray; Eyes: PERRL.  EOM grossly intact. Sinuses: nontender; Nose: nares patent without rhinorrhea, Throat: oropharynx clear, tonsils non erythematous or enlarged, uvula midline  Neck: supple without LAD Lungs: unlabored respirations, symmetrical air entry; cough: absent; no respiratory distress; CTAB Heart: regular rate and rhythm.  Radial pulses 2+ symmetrical bilaterally Skin: warm and dry, crops of dry red papules over  Left frontal temple with  some crusting Psychological: alert and cooperative; normal mood and affect  LABS:  No results found for this or any previous visit (from the past 24 hour(s)).   ASSESSMENT & PLAN:  1. Herpes zoster without complication     Meds ordered this encounter  Medications  . predniSONE (DELTASONE) 10 MG tablet    Sig: Take 2 tablets (20 mg total) by mouth daily.    Dispense:  15 tablet    Refill:  0  . valACYclovir (VALTREX) 1000 MG tablet    Sig: Take 1 tablet (1,000 mg total) by mouth 3 (three) times daily for 7 days.    Dispense:  21 tablet    Refill:  0    Discharge Instructions  Rest and use ice/heat as needed for symptomatic relief Prescribed valacyclovir 1000mg  3x/day for 7 days Prescribed prednisone taper for inflammation and pain Use OTC medications such as ibuprofen/ tylenol.  Use tramadol as needed for break-through pain Follow up with PCP if symptoms of burning, stinging, tingling or numbness occur after rash resolves, you may need additional treatment Return here or go to ER if you have any new or worsening symptoms (such as eye involvement, severe pain, or signs of secondary infection such as fever, chills, nausea, vomiting, discharge, redness or warmth over site of rash)   Reviewed expectations re: course of current medical issues. Questions answered. Outlined signs and symptoms indicating need for more acute intervention. Patient verbalized understanding. After Visit  Summary given.         , FNP 08/26/20 1324    Durward Parcel, FNP 08/26/20 1325

## 2020-08-26 NOTE — ED Triage Notes (Signed)
Pt has reddened , swollen,  painful area to LT temple x 5 days

## 2020-08-26 NOTE — Discharge Instructions (Addendum)
Rest and use ice/heat as needed for symptomatic relief Prescribed valacyclovir 1000mg 3x/day for 7 days Prescribed prednisone taper for inflammation and pain Use OTC medications such as ibuprofen/ tylenol.  Use tramadol as needed for break-through pain Follow up with PCP if symptoms of burning, stinging, tingling or numbness occur after rash resolves, you may need additional treatment Return here or go to ER if you have any new or worsening symptoms (such as eye involvement, severe pain, or signs of secondary infection such as fever, chills, nausea, vomiting, discharge, redness or warmth over site of rash) 

## 2020-10-30 IMAGING — MG DIGITAL DIAGNOSTIC BILATERAL MAMMOGRAM WITH IMPLANTS, CAD AND TO
8 of 14 series · 8 of 34 positions shown · non-contrast
Comparison: Previous exam(s).

CLINICAL DATA: Patient describes a pea-sized nodule in the LEFT
breast.

EXAM:
2D DIGITAL DIAGNOSTIC BILATERAL MAMMOGRAM WITH IMPLANTS, CAD AND
ADJUNCT TOMO
The patient has retropectoral implants. Standard and implant
displaced views were performed.

[R MLO]
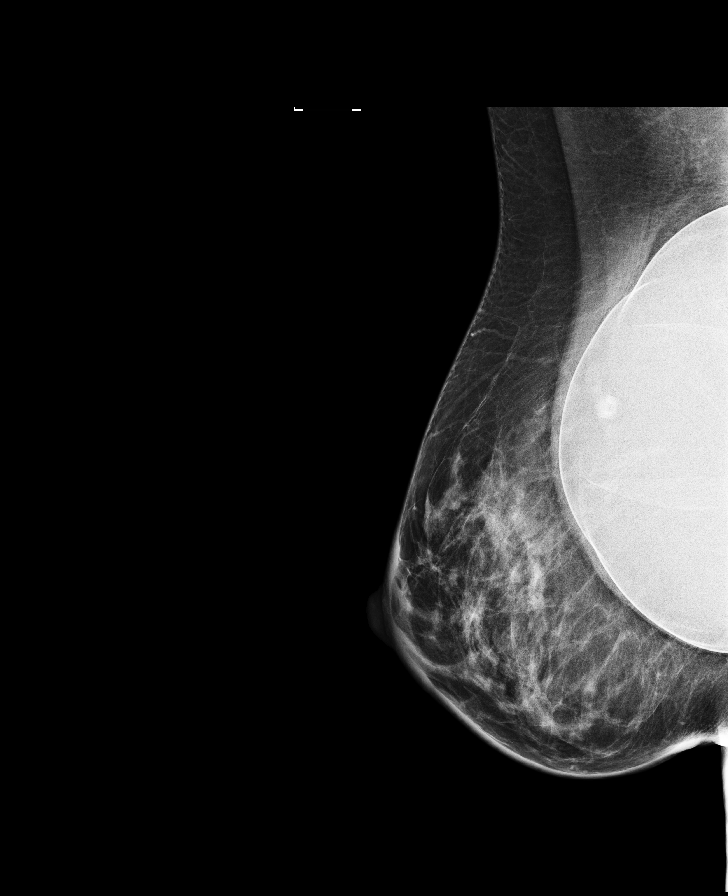

[L MLO]
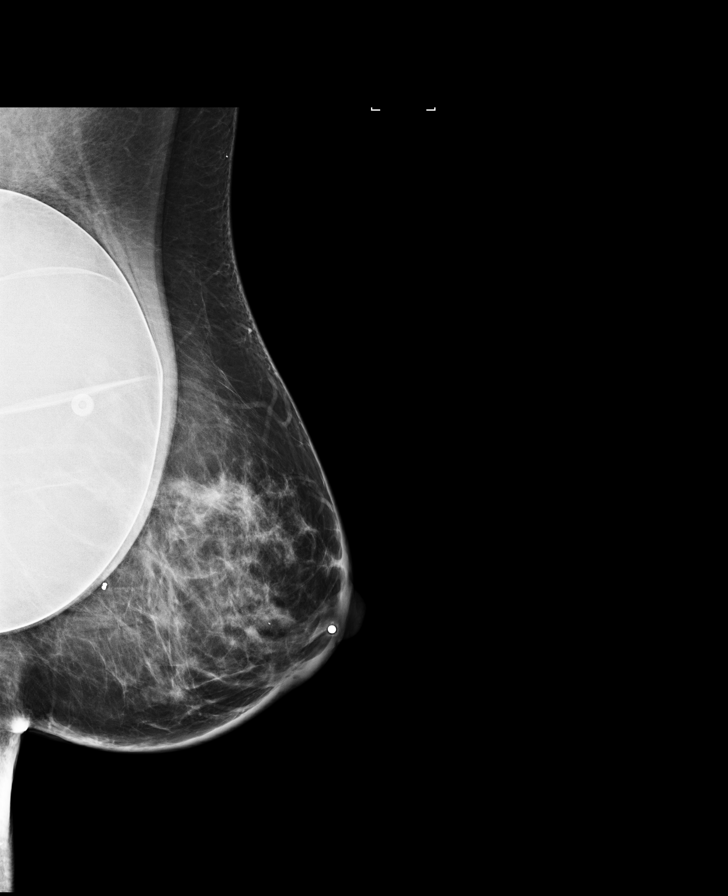

[L CC]
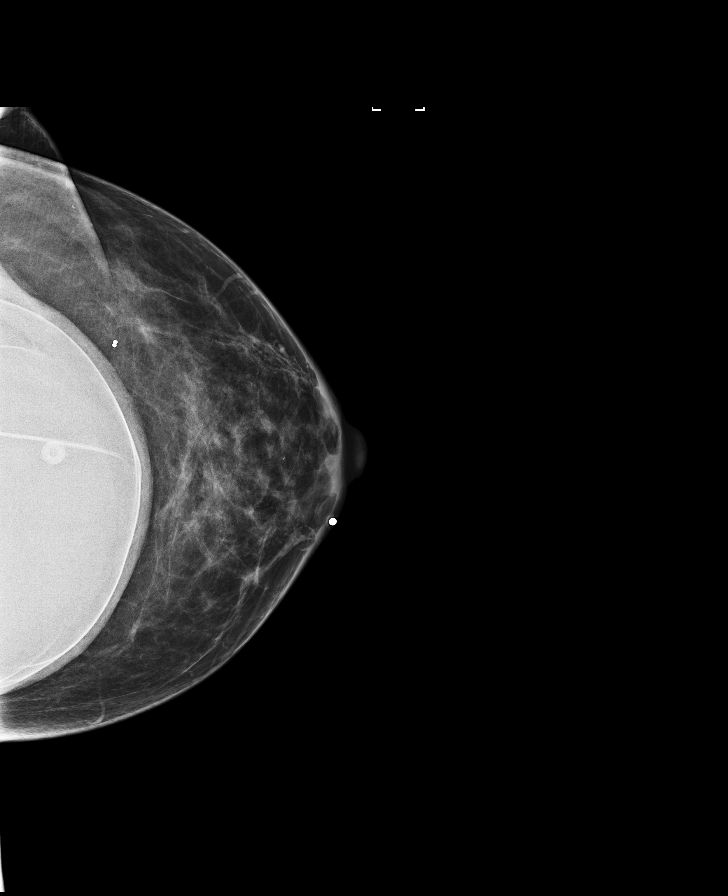

[R CC]
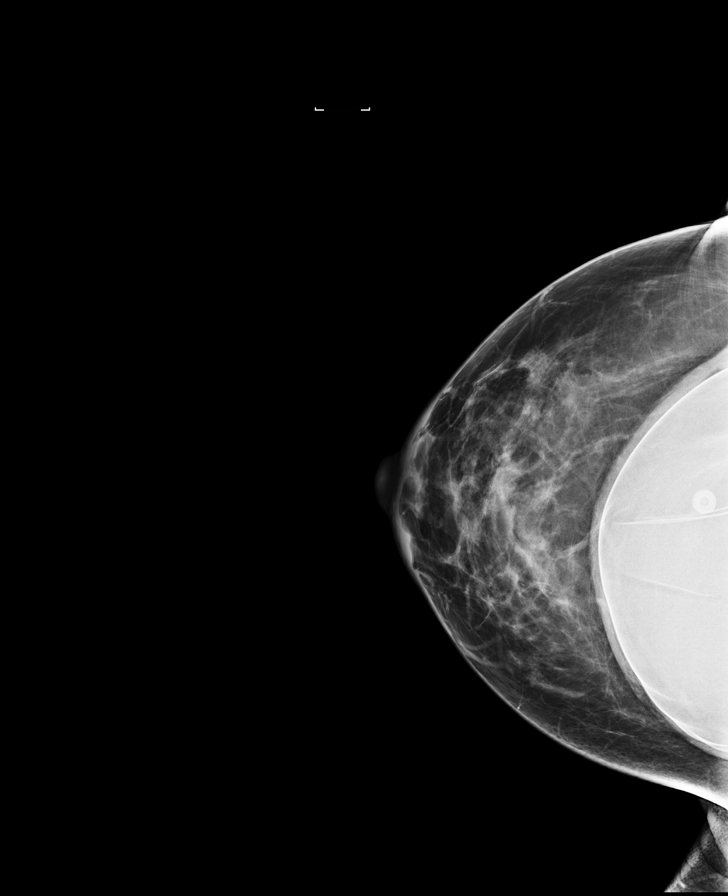

[L MLO synth-2D]
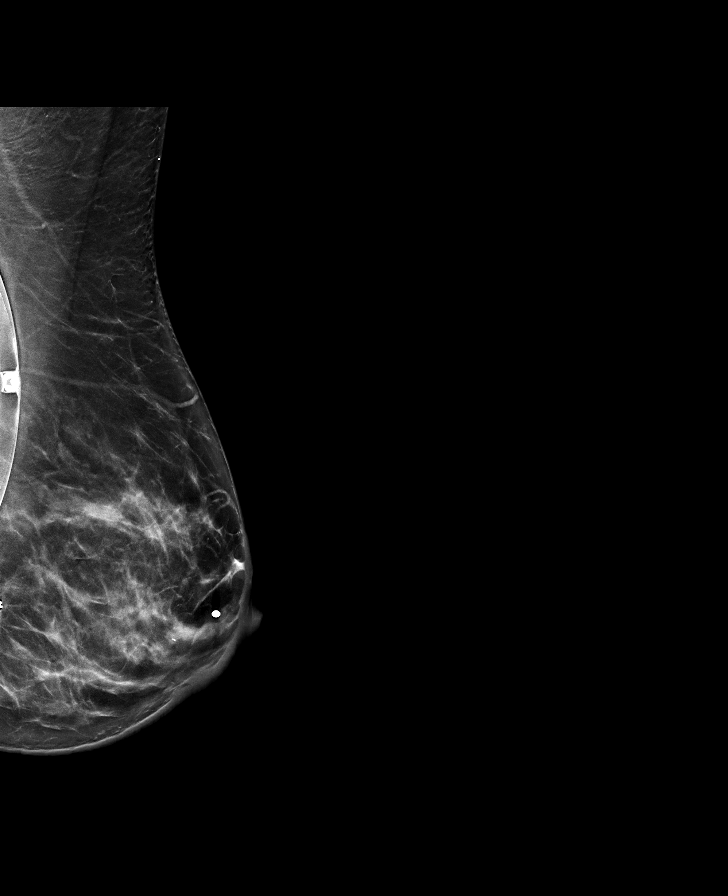

[L CC synth-2D]
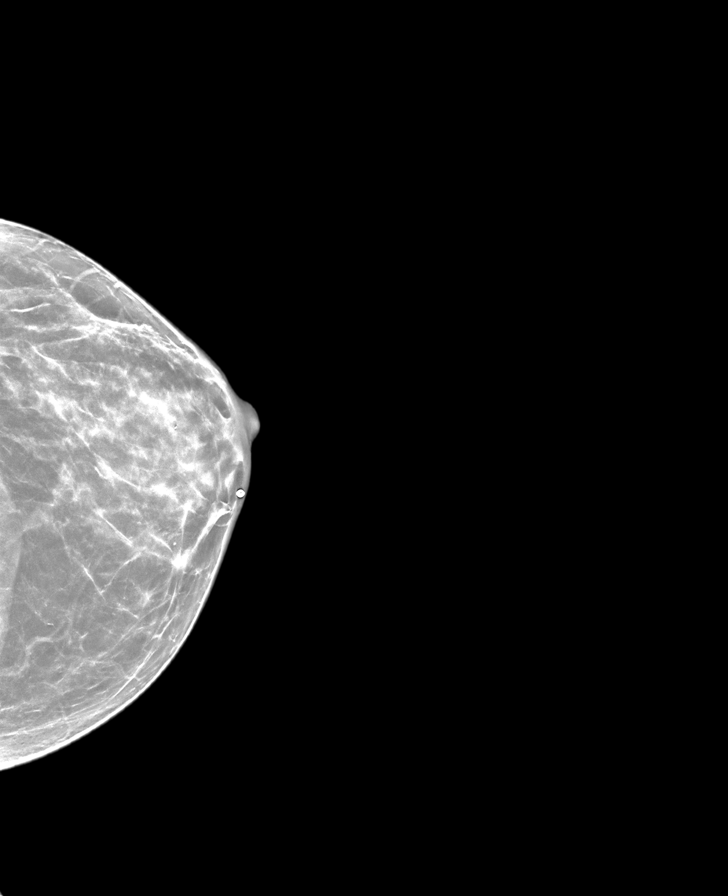

[L TAN synth-2D]
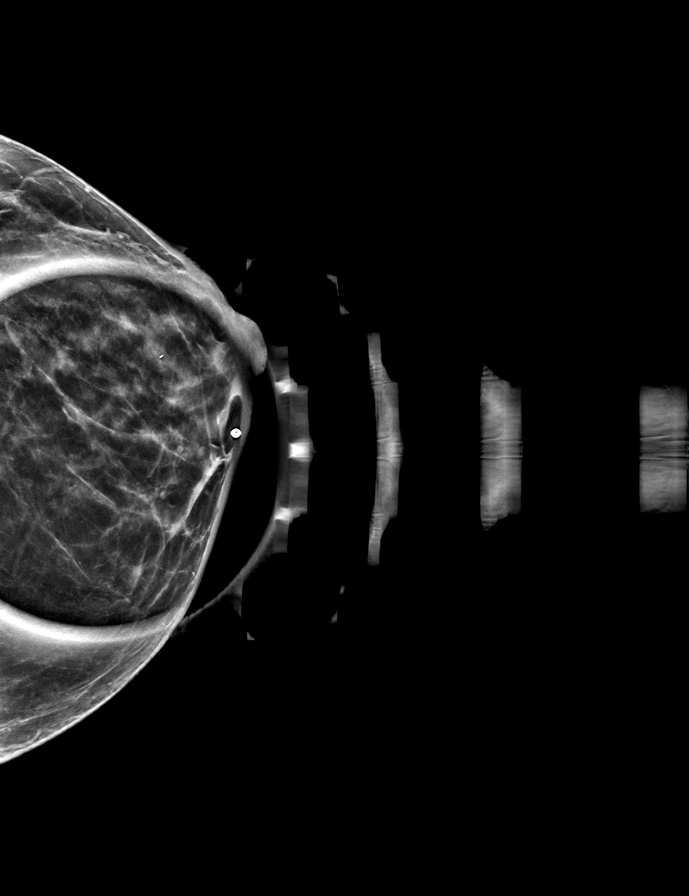

[R MLO synth-2D]
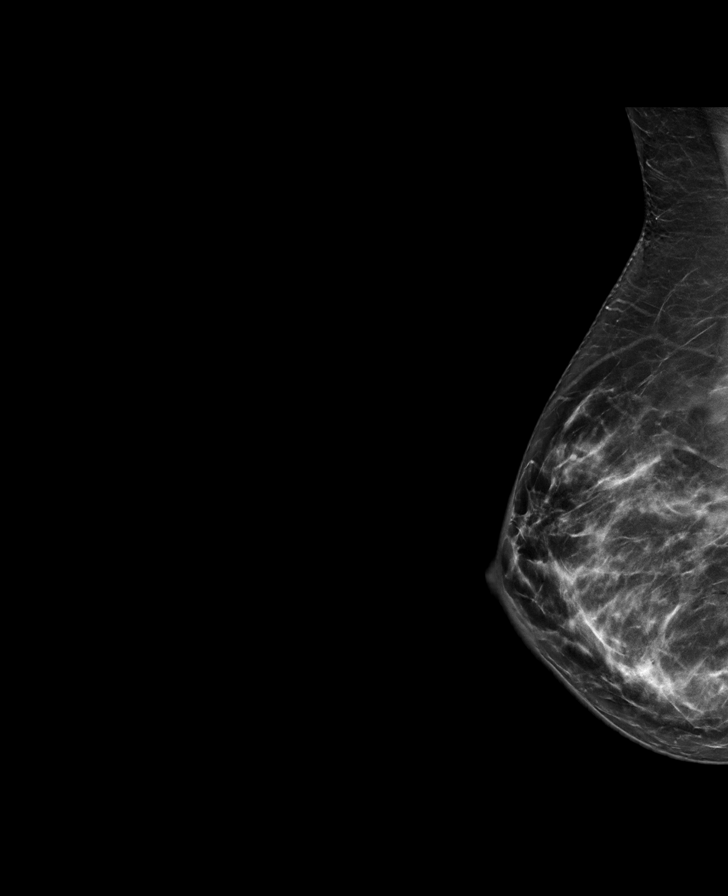

[8 of 34 positions shown; findings below may reference images not displayed]

ACR Breast Density Category b: There are scattered areas of
fibroglandular density.
FINDINGS: LEFT breast: There is a benign oil cyst within the slightly inner
LEFT breast, periareolar, measuring 7 mm, corresponding to the area
of clinical concern with overlying skin marker in place. There are
no new dominant masses, suspicious calcifications or secondary signs
of malignancy elsewhere within the LEFT breast.

RIGHT breast: There are no new dominant masses, suspicious
calcifications or secondary signs of malignancy within the RIGHT
breast.

Mammographic images were processed with CAD.
IMPRESSION: No evidence of malignancy within either breast. Benign oil cyst
within the inner LEFT breast, periareolar, measuring 7 mm,
corresponding to the area of clinical concern.

RECOMMENDATION:
Screening mammogram in one year.(Code:HV-[id number])

I have discussed the findings and recommendations with the patient.
Results were also provided in writing at the conclusion of the
visit. If applicable, a reminder letter will be sent to the patient
regarding the next appointment.

BI-RADS CATEGORY  2: Benign.

## 2021-08-21 ENCOUNTER — Ambulatory Visit (INDEPENDENT_AMBULATORY_CARE_PROVIDER_SITE_OTHER): Payer: 59 | Admitting: Gastroenterology

## 2021-08-21 ENCOUNTER — Encounter: Payer: Self-pay | Admitting: Gastroenterology

## 2021-08-21 ENCOUNTER — Other Ambulatory Visit: Payer: Self-pay

## 2021-08-21 DIAGNOSIS — K642 Third degree hemorrhoids: Secondary | ICD-10-CM | POA: Insufficient documentation

## 2021-08-21 DIAGNOSIS — K625 Hemorrhage of anus and rectum: Secondary | ICD-10-CM | POA: Diagnosis not present

## 2021-08-21 NOTE — Patient Instructions (Signed)
We are arranging a colonoscopy with Dr. Marletta Lor in the near future.  Please let me know if any issues following banding today. Limit toilet time to 2-3 minutes. Avoid straining.   I will see you after the colonoscopy for further banding. Let me know if this visit is too far out, so we can work something out.   It was a pleasure to see you today. I want to create trusting relationships with patients to provide genuine, compassionate, and quality care. I value your feedback. If you receive a survey regarding your visit,  I greatly appreciate you taking time to fill this out.   Gelene Mink, PhD, ANP-BC [facility] Gastroenterology

## 2021-08-21 NOTE — Progress Notes (Signed)
Referring Physician: Terie Purser, PA-C PCP: Terie Purser, PA-C Primary Gastroenterologist:  Dr. Marletta Lor  Chief Complaint  Patient presents with   Hemorrhoids    bleeding     HPI:   Judy Chase is a 55 y.o. female presenting today at the request of Terie Purser, PA-C, due to hemorrhoids. Patient works as a Psychiatric nurse at [employer]. She reports a colonoscopy in [place] at age 65.  No polyps that she is aware.   History of hemorrhoidectomy years ago and had been doing well. Will occaasionally have flares and push inside. Has one that prolapses and goes back in but won't stay. Has fecal soiling. Hard to keep clean. Present for almost a year. Bleeding.   No FH of colon cancer. No changes in bowel habits. Takes Metamucil for supplemental fiber. Stool is not as large as it used to be. Now about the size of finger but still having BMs. Feels like not always empty. Can't hold gas.   Past Medical History:  Diagnosis Date   Headache(784.0)    none since stopping Splenda    Past Surgical History:  Procedure Laterality Date   ABDOMINAL HYSTERECTOMY     ABDOMINOPLASTY     AUGMENTATION MAMMAPLASTY     BREAST BIOPSY     BREAST ENHANCEMENT SURGERY     CESAREAN SECTION     CHOLECYSTECTOMY     HEMORRHOID SURGERY N/A 06/04/2013   Procedure: HEMORRHOIDECTOMY;  Surgeon: Fabio Bering, MD;  Location: [facility];  Service: General;  Laterality: N/A;   REDUCTION MAMMAPLASTY Bilateral    breast lift    Current Outpatient Medications  Medication Sig Dispense Refill   Garlic (GARLIQUE PO) Take by mouth daily.     METAMUCIL FIBER PO Take by mouth daily. 2 tablespoons     Multiple Vitamin (MULTIVITAMIN WITH MINERALS) TABS Take 1 tablet by mouth daily.     Omega-3 Fatty Acids (OMEGA 3 PO) Take 2,000 mg by mouth daily.     OVER THE COUNTER MEDICATION Collagen powder once daily     No current facility-administered medications for this visit.    Allergies as of 08/21/2021 - Review  Complete 08/21/2021  Allergen Reaction Noted   Naproxen Swelling 05/12/2015   Doxycycline Rash 08/21/2021    Family History  Problem Relation Age of Onset   Diverticulosis Mother    Diabetes Father    Arthritis Sister    Lung cancer Brother    Mental illness Brother    Colon cancer Neg Hx    Colon polyps Neg Hx     Social History   Socioeconomic History   Marital status: Married    Spouse name: Not on file   Number of children: Not on file   Years of education: Not on file   Highest education level: Not on file  Occupational History   Not on file  Tobacco Use   Smoking status: Never   Smokeless tobacco: Never  Substance and Sexual Activity   Alcohol use: Yes    Alcohol/week: 0.0 standard drinks    Comment: occassional   Drug use: No   Sexual activity: Yes    Birth control/protection: Surgical  Other Topics Concern   Not on file  Social History Narrative   Not on file   Social Determinants of Health   Financial Resource Strain: Not on file  Food Insecurity: Not on file  Transportation Needs: Not on file  Physical Activity: Not on file  Stress: Not on  file  Social Connections: Not on file  Intimate Partner Violence: Not on file    Review of Systems: Gen: Denies any fever, chills, fatigue, weight loss, lack of appetite.  CV: Denies chest pain, heart palpitations, peripheral edema, syncope.  Resp: Denies shortness of breath at rest or with exertion. Denies wheezing or cough.  GI: see HPI GU : Denies urinary burning, urinary frequency, urinary hesitancy MS: Denies joint pain, muscle weakness, cramps, or limitation of movement.  Derm: Denies rash, itching, dry skin Psych: Denies depression, anxiety, memory loss, and confusion Heme: see HPI  Physical Exam: BP (!) 150/86   Pulse (!) 58   Temp (!) 97.3 F (36.3 C) (Temporal)   Ht 5\' 7"  (1.702 m)   Wt 161 lb 9.6 oz (73.3 kg)   BMI 25.31 kg/m  General:   Alert and oriented. Pleasant and cooperative.  Well-nourished and well-developed.  Head:  Normocephalic and atraumatic. Eyes:  Without icterus, sclera clear and conjunctiva pink.  Ears:  Normal auditory acuity. Mouth:  mask in place Lungs:  Clear to auscultation bilaterally. No wheezes, rales, or rhonchi. No distress.  Heart:  S1, S2 present without murmurs appreciated.  Abdomen:  +BS, soft, non-tender and non-distended. No HSM noted. No guarding or rebound. No masses appreciated.  Rectal:  prolapsing grade 3 large internal hemorrhoids left lateral position, irritated and inflamed. Mild discomfort with reduction but no significant pain. No obvious fissure. No mass on DRE. See Banding note.  Msk:  Symmetrical without gross deformities. Normal posture. Extremities:  Without edema. Neurologic:  Alert and  oriented x4;  grossly normal neurologically. Skin:  Intact without significant lesions or rashes. Psych:  Alert and cooperative. Normal mood and affect.   [facility] Banding Note:    The patient presents with symptomatic grade 3 hemorrhoids, unresponsive to maximal medical therapy, requesting rubber band ligation of her hemorrhoidal disease. All risks, benefits, and alternative forms of therapy were described and informed consent was obtained.  In the left lateral decubitus position (if anoscopy is performed) anoscopic examination revealed grade 3 large hemorrhoids primarily in the left lateral and right anterior position (s).  The decision was made to band the left lateral internal hemorrhoid, and the [facility] O'Regan System was used to perform band ligation without complication. Digital anorectal examination was then performed to assure proper positioning of the band, and to adjust the banded tissue as required. The patient was discharged home without pain or other issues. Dietary and behavioral recommendations were given and (if necessary prescriptions were given), along with follow-up instructions. The patient will return in followup and possible  additional banding as required.  No complications were encountered and the patient tolerated the procedure well.     ASSESSMENT: Judy Chase is a 55 y.o. female presenting today with at the request of 56, PA-C, due to symptomatic hemorrhoids. 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  Intermittent rectal bleeding most likely due to large Grade 3 internal hemorrhoids. DRE was done today along with anoscopy. In order to get things rolling and her concern for intervention in an expedited matter, I went ahead and placed one band in the left lateral column today after anoscopic exam. We discussed the need for colonoscopy as it has been about 7 years at an outside facility. Highly suspect this is benign anorectal source but do not want to pursue additional banding until she has had entire colon evaluated and rule out any occult etiologies. She is  agreeable to this.  Will pursue diagnostic colonoscopy in near future. She will need additional banding thereafter and may need several bandings of that column due to redundancy.   PLAN: Proceed with colonoscopy by Dr. Marletta Lor  in near future: the risks, benefits, and alternatives have been discussed with the patient in detail. The patient states understanding and desires to proceed.   Follow-up for additional banding thereafter   Gelene Mink, PhD, ANP-BC [facility]

## 2021-08-22 ENCOUNTER — Encounter: Payer: Self-pay | Admitting: Gastroenterology

## 2021-08-28 ENCOUNTER — Telehealth: Payer: Self-pay | Admitting: *Deleted

## 2021-08-28 MED ORDER — NA SULFATE-K SULFATE-MG SULF 17.5-3.13-1.6 GM/177ML PO SOLN: 1.0000 | Freq: Once | ORAL | 0 refills | Status: AC

## 2021-08-28 NOTE — Telephone Encounter (Signed)
Called pt, no answer. Unable to leave VM. When last seen in office patient requested procedure date of 11/29. Procedure scheduled. Instructions sent to her mychart. Also mailed prep instructions.   PA approved via [facility]. Auth# [id number], DOS: Oct 16, 2021 - Jan 14, 2022

## 2021-09-04 ENCOUNTER — Telehealth: Payer: Self-pay | Admitting: Gastroenterology

## 2021-09-04 NOTE — Telephone Encounter (Signed)
Judy Chase, can you make a slot for BANDING on Nov 2nd at 10am? Patient aware.

## 2021-09-19 ENCOUNTER — Ambulatory Visit: Payer: 59 | Admitting: Gastroenterology

## 2021-09-19 ENCOUNTER — Encounter: Payer: Self-pay | Admitting: Gastroenterology

## 2021-09-19 ENCOUNTER — Other Ambulatory Visit: Payer: Self-pay

## 2021-09-19 VITALS — BP 169/86 | HR 56 | Temp 97.3°F | Ht 67.0 in | Wt 163.2 lb

## 2021-09-19 DIAGNOSIS — K642 Third degree hemorrhoids: Secondary | ICD-10-CM

## 2021-09-19 NOTE — Progress Notes (Signed)
[facility] Banding Note:   Judy Chase is a 55 y.o. female presenting today for consideration of hemorrhoid banding. Last colonoscopy about 7 years ago at an outside facility. Upcoming colonoscopy scheduled in several weeks due to rectal bleeding. She has notable Grade 3 hemorrhoids. I placed left lateral band about a month ago. She is here for further banding.    The patient presents with symptomatic grade 3 hemorrhoids, unresponsive to maximal medical therapy, requesting rubber band ligation of her hemorrhoidal disease. All risks, benefits, and alternative forms of therapy were described and informed consent was obtained.  The decision was made to band neutrally,  and the [facility] O'Regan System was used to perform band ligation without complication. Digital anorectal examination was then performed to assure proper positioning of the band, and to adjust the banded tissue as required. Appears to be in the right anterior column. Left lateral column still with redundant tissue and prolapsing hemorrhoids, which we will focus on again at next visit. The patient was discharged home without pain or other issues. Dietary and behavioral recommendations were given and (if necessary prescriptions were given), along with follow-up instructions. The patient will return in several weeks for followup and will focus on left lateral column again.   No complications were encountered and the patient tolerated the procedure well.   Gelene Mink, PhD, ANP-BC [facility] G[facility]enter[facility]

## 2021-09-19 NOTE — Patient Instructions (Signed)
I will see you back on 11/16 at 1pm!  Continue to avoid straining and prolonged toilet time.   Continue fiber as you are doing.  Please message with any concerns!  I enjoyed seeing you again today! As you know, I value our relationship and want to provide genuine, compassionate, and quality care. I welcome your feedback. If you receive a survey regarding your visit,  I greatly appreciate you taking time to fill this out. See you next time!  Gelene Mink, PhD, ANP-BC [facility]

## 2021-10-03 ENCOUNTER — Ambulatory Visit: Payer: 59 | Admitting: Gastroenterology

## 2021-10-03 ENCOUNTER — Encounter: Payer: Self-pay | Admitting: Gastroenterology

## 2021-10-03 ENCOUNTER — Other Ambulatory Visit: Payer: Self-pay

## 2021-10-03 VITALS — BP 151/82 | HR 57 | Temp 97.3°F | Ht 67.0 in | Wt 164.8 lb

## 2021-10-03 DIAGNOSIS — K642 Third degree hemorrhoids: Secondary | ICD-10-CM | POA: Insufficient documentation

## 2021-10-03 NOTE — Patient Instructions (Signed)
I will see you in follow-up in a few weeks!  Please message if you need to be seen sooner than when is scheduled after the colonoscopy complete.   I enjoyed seeing you again today! As you know, I value our relationship and want to provide genuine, compassionate, and quality care. I welcome your feedback. If you receive a survey regarding your visit,  I greatly appreciate you taking time to fill this out. See you next time!  Gelene Mink, PhD, ANP-BC [facility] Gastroenterology

## 2021-10-03 NOTE — Progress Notes (Signed)
[facility] Banding Note:   Judy Chase is a 55 y.o. female presenting today for consideration of hemorrhoid banding. Last colonoscopy about 7 years ago at an outside facility. Upcoming colonoscopy scheduled for end of the month. History of notable Grade 3 hemorrhoids. She has had left lateral banding and right anterior. Left lateral column has been troublesome. She does note improvement with pressure and burning since last visit. Still has left-sided protrusion.    The patient presents with symptomatic grade 3 hemorrhoids, unresponsive to maximal medical therapy, requesting rubber band ligation of her hemorrhoidal disease. All risks, benefits, and alternative forms of therapy were described and informed consent was obtained.  The decision was made to band the left lateral internal hemorrhoid, and the [facility] O'Regan System was used to perform band ligation without complication. Digital anorectal examination was then performed to assure proper positioning of the band, and to adjust the banded tissue as required. The patient was discharged home without pain or other issues. Dietary and behavioral recommendations were given and (if necessary prescriptions were given), along with follow-up instructions. The patient will return in several weeks for followup and possible additional banding as required. The left lateral column has been troublesome and may need additional banding in the future. We also will need to focus on the right posterior column at some point.   No complications were encountered and the patient tolerated the procedure well.   Gelene Mink, PhD, ANP-BC [facility] Gastroenterology

## 2021-10-16 ENCOUNTER — Encounter (HOSPITAL_COMMUNITY): Payer: Self-pay

## 2021-10-16 ENCOUNTER — Ambulatory Visit (HOSPITAL_COMMUNITY): Payer: 59 | Admitting: Certified Registered Nurse Anesthetist

## 2021-10-16 ENCOUNTER — Encounter (HOSPITAL_COMMUNITY)
Admission: RE | Disposition: A | Discharge status: Home / Self Care | Payer: Self-pay | Source: Home / Self Care | Attending: Internal Medicine

## 2021-10-16 ENCOUNTER — Other Ambulatory Visit: Payer: Self-pay

## 2021-10-16 ENCOUNTER — Ambulatory Visit (HOSPITAL_COMMUNITY)
Admission: RE | Admit: 2021-10-16 | Discharge: 2021-10-16 | Disposition: A | Discharge status: Home / Self Care | Payer: 59 | Attending: Internal Medicine | Admitting: Internal Medicine

## 2021-10-16 DIAGNOSIS — K648 Other hemorrhoids: Secondary | ICD-10-CM | POA: Diagnosis not present

## 2021-10-16 DIAGNOSIS — K5730 Diverticulosis of large intestine without perforation or abscess without bleeding: Secondary | ICD-10-CM | POA: Insufficient documentation

## 2021-10-16 DIAGNOSIS — K626 Ulcer of anus and rectum: Secondary | ICD-10-CM | POA: Insufficient documentation

## 2021-10-16 DIAGNOSIS — K625 Hemorrhage of anus and rectum: Secondary | ICD-10-CM | POA: Diagnosis not present

## 2021-10-16 HISTORY — PX: COLONOSCOPY WITH PROPOFOL: SHX5780

## 2021-10-16 SURGERY — COLONOSCOPY WITH PROPOFOL
Anesthesia: General

## 2021-10-16 MED ORDER — LACTATED RINGERS IV SOLN: INTRAVENOUS | Status: DC | PRN

## 2021-10-16 MED ORDER — LACTATED RINGERS IV SOLN: INTRAVENOUS | Status: DC

## 2021-10-16 MED ORDER — PROPOFOL 10 MG/ML IV BOLUS
INTRAVENOUS | Status: DC | PRN
  Administered 2021-10-16 (×4): 50 mg via INTRAVENOUS
  Administered 2021-10-16: 100 mg via INTRAVENOUS

## 2021-10-16 NOTE — Anesthesia Postprocedure Evaluation (Signed)
Anesthesia Post Note  Patient: Judy Chase  Procedure(s) Performed: COLONOSCOPY WITH PROPOFOL  Patient location during evaluation: Phase II Anesthesia Type: General Level of consciousness: awake Pain management: pain level controlled Vital Signs Assessment: post-procedure vital signs reviewed and stable Respiratory status: spontaneous breathing and respiratory function stable Cardiovascular status: blood pressure returned to baseline and stable Postop Assessment: no headache and no apparent nausea or vomiting Anesthetic complications: no Comments: Late entry   No notable events documented.   Last Vitals:  Vitals:   10/16/21 0759 10/16/21 0802  BP: (!) 88/53 (!) 93/53  Pulse: 60 (!) 55  Resp: 14 13  Temp: 36.5 C   SpO2: 97% 97%    Last Pain:  Vitals:   10/16/21 0802  TempSrc:   PainSc: 0-No pain                 Windell [place]

## 2021-10-16 NOTE — Anesthesia Preprocedure Evaluation (Signed)
Anesthesia Evaluation  ?Patient identified by MRN, date of birth, ID band ?Patient awake ? ? ? ?Reviewed: ?Allergy & Precautions, H&P , NPO status , Patient's Chart, lab work & pertinent test results, reviewed documented beta blocker date and time  ? ?Airway ?Mallampati: II ? ?TM Distance: >3 FB ?Neck ROM: full ? ? ? Dental ?no notable dental hx. ? ?  ?Pulmonary ?neg pulmonary ROS,  ?  ?Pulmonary exam normal ?breath sounds clear to auscultation ? ? ? ? ? ? Cardiovascular ?Exercise Tolerance: Good ?negative cardio ROS ? ? ?Rhythm:regular Rate:Normal ? ? ?  ?Neuro/Psych ? Headaches, negative psych ROS  ? GI/Hepatic ?negative GI ROS, Neg liver ROS,   ?Endo/Other  ?negative endocrine ROS ? Renal/GU ?negative Renal ROS  ?negative genitourinary ?  ?Musculoskeletal ? ? Abdominal ?  ?Peds ? Hematology ?negative hematology ROS ?(+)   ?Anesthesia Other Findings ? ? Reproductive/Obstetrics ?negative OB ROS ? ?  ? ? ? ? ? ? ? ? ? ? ? ? ? ?  ?  ? ? ? ? ? ? ? ? ?Anesthesia Physical ?Anesthesia Plan ? ?ASA: 2 ? ?Anesthesia Plan: General  ? ?Post-op Pain Management:   ? ?Induction:  ? ?PONV Risk Score and Plan: Propofol infusion ? ?Airway Management Planned:  ? ?Additional Equipment:  ? ?Intra-op Plan:  ? ?Post-operative Plan:  ? ?Informed Consent: I have reviewed the patients History and Physical, chart, labs and discussed the procedure including the risks, benefits and alternatives for the proposed anesthesia with the patient or authorized representative who has indicated his/her understanding and acceptance.  ? ? ? ?Dental Advisory Given ? ?Plan Discussed with: CRNA ? ?Anesthesia Plan Comments:   ? ? ? ? ? ? ?Anesthesia Quick Evaluation ? ?

## 2021-10-16 NOTE — H&P (Signed)
Primary Care Physician:  [facility] Primary Gastroenterologist:  Dr. Marletta Lor  Pre-Procedure History & Physical: HPI:  Judy Chase is a 55 y.o. female is here for a colonoscopy to be performed for rectal bleeding.   Past Medical History:  Diagnosis Date   Headache(784.0)    none since stopping Splenda    Past Surgical History:  Procedure Laterality Date   ABDOMINAL HYSTERECTOMY     ABDOMINOPLASTY     AUGMENTATION MAMMAPLASTY     BREAST BIOPSY     BREAST ENHANCEMENT SURGERY     CESAREAN SECTION     CHOLECYSTECTOMY     HEMORRHOID SURGERY N/A 06/04/2013   Procedure: HEMORRHOIDECTOMY;  Surgeon: Fabio Bering, MD;  Location: [facility];  Service: General;  Laterality: N/A;   REDUCTION MAMMAPLASTY Bilateral    breast lift    Prior to Admission medications   Medication Sig Start Date End Date Taking? Authorizing Provider  Carboxymeth-Glyc-Polysorb PF (REFRESH OPTIVE MEGA-3) 0.5-1-0.5 % SOLN Place 1 drop into both eyes in the morning and at bedtime.   Yes [provider]  COLLAGEN PO Take 1 Scoop by mouth in the morning.   Yes [provider]  Garlic (GARLIQUE PO) Take 1 tablet by mouth in the morning.   Yes [provider]  hydrochlorothiazide (HYDRODIURIL) 25 MG tablet Take 25 mg by mouth daily as needed (leg swelling).   Yes [provider]  METAMUCIL FIBER PO Take 28.3 g by mouth at bedtime. 2 tablespoons   Yes [provider]  metroNIDAZOLE (METROGEL) 1 % gel Apply 1 application topically at bedtime. 05/27/21  Yes [provider]  Multiple Vitamin (MULTIVITAMIN WITH MINERALS) TABS Take 1 tablet by mouth 2 (two) times a week.   Yes [provider]  Omega-3 Fatty Acids (OMEGA 3 PO) Take 2,000 mg by mouth in the morning.   Yes [provider]  TURMERIC PO Take 2,000 mg by mouth in the morning.   Yes [provider]  acetaminophen (TYLENOL) 500 MG tablet Take 1,000 mg by mouth every 6  (six) hours as needed (pain.).    [provider]  ibuprofen (ADVIL) 200 MG tablet Take 400 mg by mouth every 8 (eight) hours as needed (for pain.).    [provider]    Allergies as of 08/28/2021 - Review Complete 08/21/2021  Allergen Reaction Noted   Naproxen Swelling 05/12/2015   Doxycycline Rash 08/21/2021    Family History  Problem Relation Age of Onset   Diverticulosis Mother    Diabetes Father    Arthritis Sister    Lung cancer Brother    Mental illness Brother    Colon cancer Neg Hx    Colon polyps Neg Hx     Social History   Socioeconomic History   Marital status: Married    Spouse name: Not on file   Number of children: Not on file   Years of education: Not on file   Highest education level: Not on file  Occupational History   Not on file  Tobacco Use   Smoking status: Never   Smokeless tobacco: Never  Vaping Use   Vaping Use: Never used  Substance and Sexual Activity   Alcohol use: Yes    Alcohol/week: 0.0 standard drinks    Comment: occassional   Drug use: No   Sexual activity: Yes    Birth control/protection: Surgical  Other Topics Concern   Not on file  Social History Narrative  Not on file   Social Determinants of Health   Financial Resource Strain: Not on file  Food Insecurity: Not on file  Transportation Needs: Not on file  Physical Activity: Not on file  Stress: Not on file  Social Connections: Not on file  Intimate Partner Violence: Not on file    Review of Systems: See HPI, otherwise negative ROS  Physical Exam: Vital signs in last 24 hours: Temp:  [97.7 F (36.5 C)] 97.7 F (36.5 C) (11/29 0641) Pulse Rate:  [53] 53 (11/29 0641) Resp:  [16] 16 (11/29 0641) BP: (150)/(74) 150/74 (11/29 0641)   General:   Alert,  Well-developed, well-nourished, pleasant and cooperative in NAD Head:  Normocephalic and atraumatic. Eyes:  Sclera clear, no icterus.   Conjunctiva pink. Ears:  Normal auditory acuity. Nose:  No  deformity, discharge,  or lesions. Mouth:  No deformity or lesions, dentition normal. Neck:  Supple; no masses or thyromegaly. Lungs:  Clear throughout to auscultation.   No wheezes, crackles, or rhonchi. No acute distress. Heart:  Regular rate and rhythm; no murmurs, clicks, rubs,  or gallops. Abdomen:  Soft, nontender and nondistended. No masses, hepatosplenomegaly or hernias noted. Normal bowel sounds, without guarding, and without rebound.   Msk:  Symmetrical without gross deformities. Normal posture. Extremities:  Without clubbing or edema. Neurologic:  Alert and  oriented x4;  grossly normal neurologically. Skin:  Intact without significant lesions or rashes. Cervical Nodes:  No significant cervical adenopathy. Psych:  Alert and cooperative. Normal mood and affect.  Impression/Plan: TIARE [name] is here for a colonoscopy to be performed for rectal bleeding.   The risks of the procedure including infection, bleed, or perforation as well as benefits, limitations, alternatives and imponderables have been reviewed with the patient. Questions have been answered. All parties agreeable.

## 2021-10-16 NOTE — Transfer of Care (Signed)
Immediate Anesthesia Transfer of Care Note  Patient: Judy Chase  Procedure(s) Performed: COLONOSCOPY WITH PROPOFOL  Patient Location: Endoscopy Unit  Anesthesia Type:General  Level of Consciousness: drowsy  Airway & Oxygen Therapy: Patient Spontanous Breathing  Post-op Assessment: Report given to RN and Post -op Vital signs reviewed and stable  Post vital signs: Reviewed and stable  Last Vitals:  Vitals Value Taken Time  BP    Temp    Pulse    Resp    SpO2      Last Pain:  Vitals:   10/16/21 0641  TempSrc: Oral  PainSc: 0-No pain      Patients Stated Pain Goal: 9 (10/16/21 0641)  Complications: No notable events documented.

## 2021-10-16 NOTE — Discharge Instructions (Addendum)
  Colonoscopy Discharge Instructions  Read the instructions outlined below and refer to this sheet in the next few weeks. These discharge instructions provide you with general information on caring for yourself after you leave the hospital. Your doctor may also give you specific instructions. While your treatment has been planned according to the most current medical practices available, unavoidable complications occasionally occur.   ACTIVITY You may resume your regular activity, but move at a slower pace for the next 24 hours.  Take frequent rest periods for the next 24 hours.  Walking will help get rid of the air and reduce the bloated feeling in your belly (abdomen).  No driving for 24 hours (because of the medicine (anesthesia) used during the test).   Do not sign any important legal documents or operate any machinery for 24 hours (because of the anesthesia used during the test).  NUTRITION Drink plenty of fluids.  You may resume your normal diet as instructed by your doctor.  Begin with a light meal and progress to your normal diet. Heavy or fried foods are harder to digest and may make you feel sick to your stomach (nauseated).  Avoid alcoholic beverages for 24 hours or as instructed.  MEDICATIONS You may resume your normal medications unless your doctor tells you otherwise.  WHAT YOU CAN EXPECT TODAY Some feelings of bloating in the abdomen.  Passage of more gas than usual.  Spotting of blood in your stool or on the toilet paper.  IF YOU HAD POLYPS REMOVED DURING THE COLONOSCOPY: No aspirin products for 7 days or as instructed.  No alcohol for 7 days or as instructed.  Eat a soft diet for the next 24 hours.  FINDING OUT THE RESULTS OF YOUR TEST Not all test results are available during your visit. If your test results are not back during the visit, make an appointment with your caregiver to find out the results. Do not assume everything is normal if you have not heard from your  caregiver or the medical facility. It is important for you to follow up on all of your test results.  SEEK IMMEDIATE MEDICAL ATTENTION IF: You have more than a spotting of blood in your stool.  Your belly is swollen (abdominal distention).  You are nauseated or vomiting.  You have a temperature over 101.  You have abdominal pain or discomfort that is severe or gets worse throughout the day.   Your colonoscopy was relatively unremarkable.  I did not find any polyps or evidence of colon cancer.  I recommend repeating colonoscopy in 10 years for colon cancer screening purposes.  You do have diverticulosis and internal hemorrhoids. I would recommend increasing fiber in your diet or adding OTC Benefiber/Metamucil. Be sure to drink at least 4 to 6 glasses of water daily. Follow up with Tobi Bastos for further banding.    I hope you have a great rest of your week!  Hennie Duos. Marletta Lor, D.O. Gastroenterology and Hepatology [facility]

## 2021-10-16 NOTE — Op Note (Addendum)
[facility] Patient Name: Judy Chase Procedure Date: 10/16/2021 7:00 AM MRN: [id number] Date of Birth: 1966-10-11 Attending MD: Elon Alas. Abbey Chatters DO CSN: [id number] Age: 55 Admit Type: Outpatient Procedure:                Colonoscopy Indications:              Rectal bleeding Providers:                Elon Alas. Abbey Chatters, DO, Janeece Riggers, RN, Casimer Bilis, Technician Referring MD:              Medicines:                See the Anesthesia note for documentation of the                            administered medications Complications:            No immediate complications. Estimated Blood Loss:     Estimated blood loss: none. Procedure:                Pre-Anesthesia Assessment:                           - The anesthesia plan was to use monitored                            anesthesia care (MAC).                           After obtaining informed consent, the colonoscope                            was passed under direct vision. Throughout the                            procedure, the patient's blood pressure, pulse, and                            oxygen saturations were monitored continuously. The                            [id number]-HQ190L ([id number]) scope was introduced through                            the anus and advanced to the the cecum, identified                            by appendiceal orifice and ileocecal valve. The                            colonoscopy was performed without difficulty. The                            patient tolerated the procedure well. The quality  of the bowel preparation was evaluated using the                            BBPS [facility] Bowel Preparation Scale) with scores                            of: Right Colon = 3, Transverse Colon = 3 and Left                            Colon = 3 (entire mucosa seen well with no residual                            staining, small fragments of stool or  opaque                            liquid). The total BBPS score equals 9. Scope In: 7:41:58 AM Scope Out: 7:55:37 AM Scope Withdrawal Time: 0 hours 7 minutes 13 seconds  Total Procedure Duration: 0 hours 13 minutes 39 seconds  Findings:      Hemorrhoids were found on perianal exam, one of which was prolapsed       internal hemorroid.      A few small-mouthed diverticula were found in the sigmoid colon.      The exam was otherwise without abnormality.      2 ulcers were found in the rectum from prior hemorroid banding. No       bleeding was present. No stigmata of recent bleeding were seen. Impression:               - Hemorrhoids found on perianal exam.                           - Diverticulosis in the sigmoid colon.                           - The examination was otherwise normal.                           - 2 ulcers in the rectum.                           - No specimens collected. Moderate Sedation:      Per Anesthesia Care Recommendation:           - Patient has a contact number available for                            emergencies. The signs and symptoms of potential                            delayed complications were discussed with the                            patient. Return to normal activities tomorrow.  Written discharge instructions were provided to the                            patient.                           - Resume previous diet.                           - Continue present medications.                           - Repeat colonoscopy in 10 years for screening                            purposes.                           - Return to GI clinic at the next available                            appointment for further banding. Procedure Code(s):        --- Professional ---                           [phone], Colonoscopy, flexible; diagnostic, including                            collection of specimen(s) by brushing or washing,                             when performed (separate procedure) Diagnosis Code(s):        --- Professional ---                           K64.9, Unspecified hemorrhoids                           K62.6, Ulcer of anus and rectum                           K62.5, Hemorrhage of anus and rectum                           K57.30, Diverticulosis of large intestine without                            perforation or abscess without bleeding CPT copyright 2019 American Medical Association. All rights reserved. The codes documented in this report are preliminary and upon coder review may  be revised to meet current compliance requirements. Elon Alas. Abbey Chatters, DO [place] Abbey Chatters, DO 10/16/2021 8:02:39 AM This report has been signed electronically. Number of Addenda: 0

## 2021-10-18 ENCOUNTER — Encounter (HOSPITAL_COMMUNITY): Payer: Self-pay | Admitting: Internal Medicine

## 2021-11-07 ENCOUNTER — Encounter: Payer: Self-pay | Admitting: Gastroenterology

## 2021-11-07 ENCOUNTER — Other Ambulatory Visit: Payer: Self-pay

## 2021-11-07 ENCOUNTER — Ambulatory Visit: Payer: 59 | Admitting: Gastroenterology

## 2021-11-07 VITALS — BP 135/83 | HR 67 | Temp 97.1°F | Ht 67.0 in | Wt 163.6 lb

## 2021-11-07 DIAGNOSIS — K642 Third degree hemorrhoids: Secondary | ICD-10-CM

## 2021-11-07 NOTE — Progress Notes (Signed)
[facility] Banding Note:   Judy Chase is a 55 y.o. female presenting today for consideration of hemorrhoid banding. Recent colonoscopy completed with hemorrhoids. She has had left lateral banding X 2 and right anterior. Left lateral has been troublesome. She still has prolapsing Grade 3 hemorrhoids in the left lateral position.    The patient presents with symptomatic grade 3 hemorrhoids, unresponsive to maximal medical therapy, requesting rubber band ligation of her hemorrhoidal disease. All risks, benefits, and alternative forms of therapy were described and informed consent was obtained.  In the left lateral decubitus position anoscopic examination revealed grade 3 hemorrhoids in the left lateral position. She has irritated and inflamed left lateral column just inside the rectum that continue to prolapse out.   The decision was made to band neutrally at first,  and the [facility] System was used to perform band ligation without complication. I think banded left laterally again. Digital anorectal examination was then performed to assure proper positioning of the band, and to adjust the banded tissue as required. The patient was discharged home without pain or other issues. Dietary and behavioral recommendations were given and (if necessary prescriptions were given), along with follow-up instructions. The patient will return in followup and possible additional banding as required. We may need to consider surgical referral due to the left lateral column; however, she would like to avoid this if at all possible.   No complications were encountered and the patient tolerated the procedure well.   Gelene Mink, PhD, ANP-BC [facility]

## 2021-11-07 NOTE — Patient Instructions (Signed)
We will see you back in January!  Please call if any pain or discomfort.   Continue to avoid straining and constipation.  Have a [place] Christmas!  I enjoyed seeing you again today! As you know, I value our relationship and want to provide genuine, compassionate, and quality care. I welcome your feedback. If you receive a survey regarding your visit,  I greatly appreciate you taking time to fill this out. See you next time!  Gelene Mink, PhD, ANP-BC [facility]

## 2021-11-26 ENCOUNTER — Other Ambulatory Visit: Payer: Self-pay | Admitting: *Deleted

## 2021-11-26 ENCOUNTER — Encounter: Payer: Self-pay | Admitting: Gastroenterology

## 2021-11-26 ENCOUNTER — Ambulatory Visit: Payer: 59 | Admitting: Gastroenterology

## 2021-11-26 ENCOUNTER — Other Ambulatory Visit: Payer: Self-pay

## 2021-11-26 VITALS — BP 142/85 | HR 53 | Temp 96.9°F | Ht 67.0 in | Wt 164.6 lb

## 2021-11-26 DIAGNOSIS — K642 Third degree hemorrhoids: Secondary | ICD-10-CM | POA: Diagnosis not present

## 2021-11-26 NOTE — Progress Notes (Addendum)
° °  [facility] Banding Note:   Judy Chase is a 56 y.o. female presenting today for consideration of hemorrhoid banding. Recent colonoscopy completed with hemorrhoids. She has had left lateral banding X 2, right anterior, and neutral position. Left lateral has been troublesome. She still has what appears to be prolapsing Grade 3 hemorrhoids in the left lateral position that remain protruding but reducible manually.    The patient presents with symptomatic grade 3 hemorrhoids, unresponsive to maximal medical therapy, requesting rubber band ligation of her hemorrhoidal disease. All risks, benefits, and alternative forms of therapy were described and informed consent was obtained.  In the left lateral decubitus position, anoscopic examination revealed grade 3 excoriated and "angry" appearing internal hemorrhoids in the left lateral position. This column is prolapsed and not within the area intended for [facility] banding. Prior scars from banding earlier are noted with deflation of columns.   I discussed with patient that attempt at banding this area was not amenable with the device, as placement of the device is more distally. Interesting scenario, and I feel we have done all we can do with outpatient banding. We discussed that we have attempted banding and she has noted improvement with other columns but remains with the troublesome left lateral area. This does clinically look like an internal hemorrhoid, but location is interesting and I feel colorectal surgeon evaluation is best advised in this case due to the location.  I have referred her to [facility].   No complications were encountered and the patient tolerated the procedure well.   Gelene Mink, PhD, ANP-BC [facility]

## 2021-11-26 NOTE — Patient Instructions (Signed)
I am referring you to a colorectal surgeon at [facility] for the left lateral column. I feel we have done all we can do with outpatient banding.  Please let me know if you do not hear from them in a timely manner.   It was great to meet you! Please let me know if any concerns!  I enjoyed seeing you again today! As you know, I value our relationship and want to provide genuine, compassionate, and quality care. I welcome your feedback. If you receive a survey regarding your visit,  I greatly appreciate you taking time to fill this out. See you next time!  Annitta Needs, PhD, ANP-BC [facility]

## 2021-12-17 ENCOUNTER — Ambulatory Visit: Payer: Self-pay | Admitting: General Surgery

## 2021-12-17 NOTE — H&P (Signed)
REFERRING PHYSICIAN:  None  PROVIDER:  [name], MD  MRN: [id number] DOB: August 17, 1966 DATE OF ENCOUNTER: 12/17/2021  Subjective   Chief Complaint: Follow-up     History of Present Illness:  Patient is referred by Lewie Loron, NP, from [facility] gastroenterology for surgical evaluation and management of prolapsed internal hemorrhoids.  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  She has had problems with hemorrhoids dating back to her pregnancies.  She underwent open L hemorrhoidectomy many years ago.  Recently, she has been undergoing banding for a prolapsed hemorrhoid and bleeding.  She has persistent problems with a prolapsed hemorrhoid on the left side.  She has infrequent bleeding with bowel movements.  She has discomfort, leakage, bleeding and itching.  She has some mild incontinence to flatus.  She presents today on referral for consideration for hemorrhoidectomy.  Colonoscopy was performed 11/22  History reviewed. No pertinent past medical history. Past Surgical History:  Procedure Laterality Date   CESAREAN SECTION  1985   breast implants     CHOLECYSTECTOMY     HEMORROIDECTOMY     Social History   Socioeconomic History   Marital status: Married  Tobacco Use   Smoking status: Never   Smokeless tobacco: Never  Substance and Sexual Activity   Alcohol use: Yes   Drug use: Never   Family History  Problem Relation Age of Onset   High blood pressure (Hypertension) Mother    Hyperlipidemia (Elevated cholesterol) Mother    Diabetes Father    Obesity Sister     Current Outpatient Medications:    collagen, bovine, 100 % Powd, Take by mouth, Disp: , Rfl:    omega-3 fatty acids 100 mg Chew, Take by mouth, Disp: , Rfl:    psyllium, aspartame, (METAMUCIL FIBER SINGLES) 3.4 gram packet, Take by mouth, Disp: , Rfl:  Allergies  Allergen Reactions   Naproxen Swelling    Body aches Body aches    Doxycycline Rash   Review of Systems - Negative  except as stated in HPI  Objective:  Physical Exam   GENERAL APPEARANCE Development: normal Nutritional status: normal Gross deformities: none  CV: RRR  Lungs: CTA  Abd: soft   RECTAL External examination shows an obvious prolapsed hemorrhoid in the left lateral column.  A portion of this shows some ulceration and chronic changes.  There is no thrombosis.  Eversion shows no sign of fistula or fissure.  Digital rectal exam shows normal tone.     Procedure: Anoscopy Surgeon: Maisie Fus After the risks and benefits were explained, written consent was obtained for above procedure.  A medical assistant chaperone was present thoroughout the entire procedure.  Anesthesia: none Diagnosis: rectal bleeding Findings: prolapsed R ant mucosa, grade 3 L ant hemorrhoid      Assessment and Plan:  Diagnoses and all orders for this visit:  Prolapsed internal hemorrhoids, grade 3    Patient presents on referral from gastroenterology for consideration for hemorrhoidectomy.  Patient has a chronically prolapsed hemorrhoidal column in the left lateral position.  This is not amenable to banding.  There is also some prolapse mucosa on the R anterior side.  I have recommended at least a hemorrhoidectomy on the left side.  We may need to pexy her mucosa on the right side as well.  Risk include bleeding, pain and recurrence.  Patient is well aware of these due to her previous surgery.  We also discussed that hemorrhoid tissue contributes to continence and this can worsen  after hemorrhoidectomy.  I believe the patient understands these and agrees to proceed with surgery.  Patient is provided with written literature on hemorrhoid management and hemorrhoid surgery.  Vanita Panda, MD Colon and Rectal Surgery [facility]

## 2022-01-02 ENCOUNTER — Ambulatory Visit: Payer: 59 | Admitting: Gastroenterology

## 2022-01-10 ENCOUNTER — Encounter (HOSPITAL_BASED_OUTPATIENT_CLINIC_OR_DEPARTMENT_OTHER): Payer: Self-pay | Admitting: General Surgery

## 2022-01-10 ENCOUNTER — Other Ambulatory Visit: Payer: Self-pay

## 2022-01-10 NOTE — Progress Notes (Signed)
Spoke w/ via phone for pre-op interview---pt Lab needs dos----     none          Lab results------none COVID test -----patient states asymptomatic no test needed Arrive at -------700 am 01-17-2022 NPO after MN NO Solid Food.  Clear liquids from MN until---600 am  Med rec completed Medications to take morning of surgery -----eye drop prn Diabetic medication -----n/a Patient instructed no nail polish to be worn day of surgery pt has s  & s with clear with  nail beds blue tips on end only will leave on for surgery Patient instructed to bring photo id and insurance card day of surgery Patient aware to have Driver (ride ) / caregiver  Judy Chase husband will stay   for 24 hours after surgery  Patient Special Instructions -----none Pre-Op special Istructions -----none Patient verbalized understanding of instructions that were given at this phone interview. Patient denies shortness of breath, chest pain, fever, cough at this phone interview.

## 2022-01-17 ENCOUNTER — Encounter (HOSPITAL_BASED_OUTPATIENT_CLINIC_OR_DEPARTMENT_OTHER)
Admission: RE | Disposition: A | Discharge status: Home / Self Care | Payer: Self-pay | Source: Home / Self Care | Attending: General Surgery

## 2022-01-17 ENCOUNTER — Ambulatory Visit (HOSPITAL_BASED_OUTPATIENT_CLINIC_OR_DEPARTMENT_OTHER): Payer: 59 | Admitting: Certified Registered Nurse Anesthetist

## 2022-01-17 ENCOUNTER — Other Ambulatory Visit: Payer: Self-pay

## 2022-01-17 ENCOUNTER — Ambulatory Visit (HOSPITAL_BASED_OUTPATIENT_CLINIC_OR_DEPARTMENT_OTHER)
Admission: RE | Admit: 2022-01-17 | Discharge: 2022-01-17 | Disposition: A | Discharge status: Home / Self Care | Payer: 59 | Attending: General Surgery | Admitting: General Surgery

## 2022-01-17 ENCOUNTER — Encounter (HOSPITAL_BASED_OUTPATIENT_CLINIC_OR_DEPARTMENT_OTHER): Payer: Self-pay | Admitting: General Surgery

## 2022-01-17 DIAGNOSIS — K642 Third degree hemorrhoids: Secondary | ICD-10-CM | POA: Insufficient documentation

## 2022-01-17 HISTORY — DX: Dry eye syndrome of bilateral lacrimal glands: H04.123

## 2022-01-17 HISTORY — DX: Presence of spectacles and contact lenses: Z97.3

## 2022-01-17 HISTORY — DX: Rosacea, unspecified: L71.9

## 2022-01-17 HISTORY — PX: HEMORRHOID SURGERY: SHX153

## 2022-01-17 HISTORY — DX: Personal history of other diseases of the nervous system and sense organs: Z86.69

## 2022-01-17 SURGERY — HEMORRHOIDECTOMY PROLAPSED
Anesthesia: Monitor Anesthesia Care | Site: Rectum

## 2022-01-17 MED ORDER — SODIUM CHLORIDE 0.9% FLUSH: 3.0000 mL | Freq: Two times a day (BID) | INTRAVENOUS | Status: DC

## 2022-01-17 MED ORDER — ONDANSETRON HCL 4 MG/2ML IJ SOLN
INTRAMUSCULAR | Status: AC
  Filled 2022-01-17: qty 2

## 2022-01-17 MED ORDER — GABAPENTIN 300 MG PO CAPS
300.0000 mg | ORAL_CAPSULE | ORAL | Status: AC
  Administered 2022-01-17: 300 mg via ORAL

## 2022-01-17 MED ORDER — PROPOFOL 500 MG/50ML IV EMUL
INTRAVENOUS | Status: DC | PRN
Start: 2022-01-17 — End: 2022-01-17
  Administered 2022-01-17: 100 ug/kg/min via INTRAVENOUS

## 2022-01-17 MED ORDER — BUPIVACAINE LIPOSOME 1.3 % IJ SUSP: 20.0000 mL | Freq: Once | INTRAMUSCULAR | Status: DC

## 2022-01-17 MED ORDER — FENTANYL CITRATE (PF) 100 MCG/2ML IJ SOLN
INTRAMUSCULAR | Status: DC | PRN
  Administered 2022-01-17 (×2): 50 ug via INTRAVENOUS

## 2022-01-17 MED ORDER — ONDANSETRON HCL 4 MG/2ML IJ SOLN
INTRAMUSCULAR | Status: DC | PRN
  Administered 2022-01-17: 4 mg via INTRAVENOUS

## 2022-01-17 MED ORDER — HYDROCODONE-ACETAMINOPHEN 5-325 MG PO TABS
1.0000 | ORAL_TABLET | Freq: Four times a day (QID) | ORAL | 0 refills | Status: DC | PRN
Start: 2022-01-17 — End: 2024-06-29

## 2022-01-17 MED ORDER — BUPIVACAINE LIPOSOME 1.3 % IJ SUSP
INTRAMUSCULAR | Status: DC | PRN
  Administered 2022-01-17: 20 mL

## 2022-01-17 MED ORDER — LACTATED RINGERS IV SOLN: INTRAVENOUS | Status: DC

## 2022-01-17 MED ORDER — LIDOCAINE 5 % EX OINT
TOPICAL_OINTMENT | CUTANEOUS | Status: DC | PRN
  Administered 2022-01-17: 1 via TOPICAL

## 2022-01-17 MED ORDER — BUPIVACAINE-EPINEPHRINE 0.5% -1:200000 IJ SOLN
INTRAMUSCULAR | Status: DC | PRN
  Administered 2022-01-17: 20 mL

## 2022-01-17 MED ORDER — MIDAZOLAM HCL 5 MG/5ML IJ SOLN
INTRAMUSCULAR | Status: DC | PRN
  Administered 2022-01-17: 2 mg via INTRAVENOUS

## 2022-01-17 MED ORDER — KETAMINE HCL 50 MG/5ML IJ SOSY
PREFILLED_SYRINGE | INTRAMUSCULAR | Status: AC
  Filled 2022-01-17: qty 5

## 2022-01-17 MED ORDER — MIDAZOLAM HCL 2 MG/2ML IJ SOLN
INTRAMUSCULAR | Status: AC
  Filled 2022-01-17: qty 2

## 2022-01-17 MED ORDER — ACETAMINOPHEN 325 MG RE SUPP: 650.0000 mg | RECTAL | Status: DC | PRN

## 2022-01-17 MED ORDER — ACETAMINOPHEN 325 MG PO TABS: 650.0000 mg | ORAL_TABLET | ORAL | Status: DC | PRN

## 2022-01-17 MED ORDER — GABAPENTIN 300 MG PO CAPS
ORAL_CAPSULE | ORAL | Status: AC
  Filled 2022-01-17: qty 1

## 2022-01-17 MED ORDER — ACETAMINOPHEN 500 MG PO TABS
ORAL_TABLET | ORAL | Status: AC
  Filled 2022-01-17: qty 2

## 2022-01-17 MED ORDER — 0.9 % SODIUM CHLORIDE (POUR BTL) OPTIME
TOPICAL | Status: DC | PRN
  Administered 2022-01-17: 500 mL

## 2022-01-17 MED ORDER — SODIUM CHLORIDE 0.9 % IV SOLN: 250.0000 mL | INTRAVENOUS | Status: DC | PRN

## 2022-01-17 MED ORDER — SODIUM CHLORIDE 0.9% FLUSH
3.0000 mL | INTRAVENOUS | Status: DC | PRN
Start: 2022-01-17 — End: 2022-01-17

## 2022-01-17 MED ORDER — ACETAMINOPHEN 500 MG PO TABS
1000.0000 mg | ORAL_TABLET | ORAL | Status: AC
  Administered 2022-01-17: 1000 mg via ORAL

## 2022-01-17 MED ORDER — HYDROCODONE-ACETAMINOPHEN 5-325 MG PO TABS: 1.0000 | ORAL_TABLET | Freq: Four times a day (QID) | ORAL | 0 refills | Status: DC | PRN

## 2022-01-17 MED ORDER — KETAMINE HCL-SODIUM CHLORIDE 100-0.9 MG/10ML-% IV SOSY
PREFILLED_SYRINGE | INTRAVENOUS | Status: DC | PRN
  Administered 2022-01-17: 20 mg via INTRAVENOUS
  Administered 2022-01-17: 10 mg via INTRAVENOUS

## 2022-01-17 MED ORDER — OXYCODONE HCL 5 MG PO TABS: 5.0000 mg | ORAL_TABLET | ORAL | Status: DC | PRN

## 2022-01-17 MED ORDER — PROPOFOL 500 MG/50ML IV EMUL
INTRAVENOUS | Status: AC
  Filled 2022-01-17: qty 50

## 2022-01-17 MED ORDER — FENTANYL CITRATE (PF) 100 MCG/2ML IJ SOLN
INTRAMUSCULAR | Status: AC
  Filled 2022-01-17: qty 2

## 2022-01-17 SURGICAL SUPPLY — 36 items
COVER BACK TABLE 60X90IN (DRAPES) ×2 IMPLANT
COVER MAYO STAND STRL (DRAPES) ×2 IMPLANT
DRAPE LAPAROTOMY 100X72 PEDS (DRAPES) ×2 IMPLANT
DRAPE UTILITY XL STRL (DRAPES) ×2 IMPLANT
DRSG PAD ABDOMINAL 8X10 ST (GAUZE/BANDAGES/DRESSINGS) ×2 IMPLANT
ELECT REM PT RETURN 9FT ADLT (ELECTROSURGICAL) ×2
ELECTRODE REM PT RTRN 9FT ADLT (ELECTROSURGICAL) ×1 IMPLANT
GAUZE 4X4 16PLY ~~LOC~~+RFID DBL (SPONGE) ×2 IMPLANT
GAUZE SPONGE 4X4 12PLY STRL (GAUZE/BANDAGES/DRESSINGS) ×1 IMPLANT
GLOVE SURG ENC MOIS LTX SZ6.5 (GLOVE) ×2 IMPLANT
GLOVE SURG POLYISO LF SZ6.5 (GLOVE) ×1 IMPLANT
GLOVE SURG UNDER LTX SZ6.5 (GLOVE) ×2 IMPLANT
GLOVE SURG UNDER POLY LF SZ7 (GLOVE) ×2 IMPLANT
GOWN STRL REUS W/TWL LRG LVL3 (GOWN DISPOSABLE) ×2 IMPLANT
KIT TURNOVER CYSTO (KITS) ×2 IMPLANT
NEEDLE HYPO 22GX1.5 SAFETY (NEEDLE) ×2 IMPLANT
NS IRRIG 500ML POUR BTL (IV SOLUTION) ×2 IMPLANT
PACK BASIN DAY SURGERY FS (CUSTOM PROCEDURE TRAY) ×2 IMPLANT
PAD ARMBOARD 7.5X6 YLW CONV (MISCELLANEOUS) IMPLANT
PANTS MESH DISP LRG (UNDERPADS AND DIAPERS) IMPLANT
PANTS MESH DISPOSABLE L (UNDERPADS AND DIAPERS)
PENCIL SMOKE EVACUATOR (MISCELLANEOUS) ×2 IMPLANT
SPONGE HEMORRHOID 8X3CM (HEMOSTASIS) IMPLANT
SPONGE SURGIFOAM ABS GEL 100 (HEMOSTASIS) IMPLANT
SPONGE SURGIFOAM ABS GEL 12-7 (HEMOSTASIS) IMPLANT
SUT CHROMIC 2 0 SH (SUTURE) ×3 IMPLANT
SUT CHROMIC 3 0 SH 27 (SUTURE) ×3 IMPLANT
SUT VIC AB 2-0 SH 27 (SUTURE)
SUT VIC AB 2-0 SH 27XBRD (SUTURE) IMPLANT
SUT VIC AB 4-0 SH 18 (SUTURE) IMPLANT
SYR CONTROL 10ML LL (SYRINGE) ×2 IMPLANT
TOWEL OR 17X26 10 PK STRL BLUE (TOWEL DISPOSABLE) ×2 IMPLANT
TRAY DSU PREP LF (CUSTOM PROCEDURE TRAY) ×2 IMPLANT
TUBE CONNECTING 12X1/4 (SUCTIONS) ×2 IMPLANT
WATER STERILE IRR 500ML POUR (IV SOLUTION) IMPLANT
YANKAUER SUCT BULB TIP NO VENT (SUCTIONS) ×2 IMPLANT

## 2022-01-17 NOTE — H&P (Signed)
?REFERRING PHYSICIAN:  None ?  ?PROVIDER:  [name], MD ?  ?MRN: [id number] ?DOB: 03-04-1966 ? ?  ?Subjective  ?  ?Chief Complaint: Follow-up ?  ?  ?  ?History of Present Illness: ?  ?Patient is referred by Judy Loron, NP, from [facility] gastroenterology for surgical evaluation and management of prolapsed internal hemorrhoids.  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  She has had problems with hemorrhoids dating back to her pregnancies.  She underwent open L hemorrhoidectomy many years ago.  Recently, she has been undergoing banding for a prolapsed hemorrhoid and bleeding.  She has persistent problems with a prolapsed hemorrhoid on the left side.  She has infrequent bleeding with bowel movements.  She has discomfort, leakage, bleeding and itching.  She has some mild incontinence to flatus.  She presents today on referral for consideration for hemorrhoidectomy.  Colonoscopy was performed 11/22 ?  ?History reviewed. No pertinent past medical history. ?     ?Past Surgical History:  ?Procedure Laterality Date  ? CESAREAN SECTION   1985  ? breast implants      ? CHOLECYSTECTOMY      ? HEMORROIDECTOMY      ?  ?Social History  ?  ?    ?Socioeconomic History  ? Marital status: Married  ?Tobacco Use  ? Smoking status: Never  ? Smokeless tobacco: Never  ?Substance and Sexual Activity  ? Alcohol use: Yes  ? Drug use: Never  ?  ?     ?Family History  ?Problem Relation Age of Onset  ? High blood pressure (Hypertension) Mother    ? Hyperlipidemia (Elevated cholesterol) Mother    ? Diabetes Father    ? Obesity Sister    ?  ?  ?Current Outpatient Medications:  ?  collagen, bovine, 100 % Powd, Take by mouth, Disp: , Rfl:  ?  omega-3 fatty acids 100 mg Chew, Take by mouth, Disp: , Rfl:  ?  psyllium, aspartame, (METAMUCIL FIBER SINGLES) 3.4 gram packet, Take by mouth, Disp: , Rfl:  ?     ?Allergies  ?Allergen Reactions  ? Naproxen Swelling  ?    Body aches ?Body aches ?   ? Doxycycline Rash  ?  ?Review of  Systems - Negative except as stated in HPI ?  ?Objective:  ?Physical Exam  ?  ?GENERAL APPEARANCE ?Development: normal ?Nutritional status: normal ?Gross deformities: none ?  ?CV: RRR ? ?Lungs: CTA ? ?Abd: soft  ?  ?RECTAL ?External examination shows an obvious prolapsed hemorrhoid in the left lateral column.  A portion of this shows some ulceration and chronic changes.  There is no thrombosis.  Eversion shows no sign of fistula or fissure.  Digital rectal exam shows normal tone.   ?  ?  ?Procedure: Anoscopy ?Surgeon: Maisie Fus ?After the risks and benefits were explained, written consent was obtained for above procedure.  A medical assistant chaperone was present thoroughout the entire procedure.  ?Anesthesia: none ?Diagnosis: rectal bleeding ?Findings: prolapsed R ant mucosa, grade 3 L ant hemorrhoid  ?  ?  ?  ?  ?Assessment and Plan:  ?Diagnoses and all orders for this visit: ?  ?Prolapsed internal hemorrhoids, grade 3 ?  ?  ?Patient presents on referral from gastroenterology for consideration for hemorrhoidectomy.  Patient has a chronically prolapsed hemorrhoidal column in the left lateral position.  This is not amenable to banding.  There is also some prolapse mucosa on the R anterior side.  I have  recommended at least a hemorrhoidectomy on the left side.  We may need to pexy her mucosa on the right side as well.  Risk include bleeding, pain and recurrence.  Patient is well aware of these due to her previous surgery.  We also discussed that hemorrhoid tissue contributes to continence and this can worsen after hemorrhoidectomy.  I believe the patient understands these and agrees to proceed with surgery. ?  ? ?

## 2022-01-17 NOTE — Anesthesia Postprocedure Evaluation (Signed)
Anesthesia Post Note ? ?Patient: Judy Chase ? ?Procedure(s) Performed: SINGLE COLUMN HEMORRHOIDECTOMY (Rectum) ? ?  ? ?Patient location during evaluation: PACU ?Anesthesia Type: MAC ?Level of consciousness: awake and alert ?Pain management: pain level controlled ?Vital Signs Assessment: post-procedure vital signs reviewed and stable ?Respiratory status: spontaneous breathing, nonlabored ventilation, respiratory function stable and patient connected to nasal cannula oxygen ?Cardiovascular status: stable and blood pressure returned to baseline ?Postop Assessment: no apparent nausea or vomiting ?Anesthetic complications: no ? ? ?No notable events documented. ? ?Last Vitals:  ?Vitals:  ? 01/17/22 1029 01/17/22 1117  ?BP: 113/67 (!) 145/74  ?Pulse: (!) 51 (!) 51  ?Resp: 13 18  ?Temp: (!) 36.3 ?C   ?SpO2: 100% 100%  ?  ?Last Pain:  ?Vitals:  ? 01/17/22 1117  ?TempSrc:   ?PainSc: 0-No pain  ? ? ?  ?  ?  ?  ?  ?  ? ?Effie Berkshire ? ? ? ? ?

## 2022-01-17 NOTE — Transfer of Care (Signed)
Immediate Anesthesia Transfer of Care Note ? ?Patient: Judy Chase ? ?Procedure(s) Performed: SINGLE COLUMN HEMORRHOIDECTOMY (Rectum) ? ?Patient Location: PACU ? ?Anesthesia Type:MAC ? ?Level of Consciousness: awake, alert  and oriented ? ?Airway & Oxygen Therapy: Patient Spontanous Breathing and Patient connected to face mask oxygen ? ?Post-op Assessment: Report given to RN and Post -op Vital signs reviewed and stable ? ?Post vital signs: Reviewed and stable ? ?Last Vitals:  ?Vitals Value Taken Time  ?BP 106/49 01/17/22 0956  ?Temp    ?Pulse 62 01/17/22 0957  ?Resp 13 01/17/22 0957  ?SpO2 100 % 01/17/22 0957  ?Vitals shown include unvalidated device data. ? ?Last Pain:  ?Vitals:  ? 01/17/22 0714  ?TempSrc: Oral  ?PainSc: 0-No pain  ?   ? ?Patients Stated Pain Goal: 6 (01/17/22 2787) ? ?Complications: No notable events documented. ?

## 2022-01-17 NOTE — Op Note (Signed)
01/17/2022 ? ?9:49 AM ? ?PATIENT:  Judy Chase  56 y.o. female ? ?Patient Care Team: ?[place], [place] as PCP - General (Family Medicine) ?[facility], [employer] (Family Medicine) ?Eloise Harman, DO as Consulting Physician (Gastroenterology) ? ?PRE-OPERATIVE DIAGNOSIS:  GRADE 3 PROLAPSED HEMORRHOID ? ?POST-OPERATIVE DIAGNOSIS:  GRADE 3 PROLAPSED HEMORRHOID ? ?PROCEDURE:  SINGLE COLUMN HEMORRHOIDECTOMY ? ?SURGEON:  Surgeon(s): ?Leighton Ruff, MD ? ?ASSISTANT: none  ? ?ANESTHESIA:   local and MAC ? ?SPECIMEN:  Source of Specimen:  Left lateral hemorrhoid ? ?DISPOSITION OF SPECIMEN:  PATHOLOGY ? ?COUNTS:  YES ? ?PLAN OF CARE: Discharge to home after PACU ? ?PATIENT DISPOSITION:  PACU - hemodynamically stable. ? ?INDICATION: 56 y.o. F with Grade 3 hemorrhoid ? ? ?OR FINDINGS: Grade 3 left lateral internal and external hemorrhoid ? ?DESCRIPTION: the patient was identified in the preoperative holding area and taken to the OR where they were laid on the operating room table.  MAC anesthesia was induced without difficulty. The patient was then positioned in prone jackknife position with buttocks gently taped apart.  The patient was then prepped and draped in usual sterile fashion.  SCDs were noted to be in place prior to the initiation of anesthesia. A surgical timeout was performed indicating the correct patient, procedure, positioning and need for preoperative antibiotics.  A rectal block was performed using Marcaine with epinephrine mixed with Exparel.   ? ?I began with a digital rectal exam.  There were no masses noted.  Rectal tone was good.  I then placed a Hill-Ferguson anoscope into the anal canal and evaluated this completely.  There was a grade 3 left lateral hemorrhoid with chronic prolapse.  Right side of the anal canal appeared normal.  I began by making an incision in the perirectal skin of the left lateral hemorrhoid using a 10 blade scalpel.  Dissection was carried down to the  level of the sphincter complex using Metzenbaum scissors.  The sphincter complex was completely were preserved and the remaining hemorrhoidal tissue above this was excised using the Metzenbaum scissors.  I continued my dissection down into the anal canal to the distal rectal mucosa.  The specimen was then sent to pathology for further examination.  The internal portion of the incision was closed using a running 2-0 chromic suture.  A running 3-0 chromic suture was used to close from the dentate line distal.  Hemostasis was good.  Additional Marcaine was placed around the incision site.  Lidocaine ointment and a sterile dressing was applied.  The patient was then awakened from anesthesia and sent to the postanesthesia care unit in stable condition.  All counts were correct per operating room staff. ? ?Rosario Adie, MD ? ?Colorectal and General Surgery ?[facility]  ? ? ?

## 2022-01-17 NOTE — Anesthesia Preprocedure Evaluation (Signed)
Anesthesia Evaluation  ?Patient identified by MRN, date of birth, ID band ?Patient awake ? ? ? ?Reviewed: ?Allergy & Precautions, NPO status , Patient's Chart, lab work & pertinent test results ? ?Airway ?Mallampati: II ? ?TM Distance: >3 FB ?Neck ROM: Full ? ? ? Dental ? ?(+) Teeth Intact, Dental Advisory Given ?  ?Pulmonary ?neg pulmonary ROS,  ?  ?breath sounds clear to auscultation ? ? ? ? ? ? Cardiovascular ?negative cardio ROS ? ? ?Rhythm:Regular Rate:Normal ? ? ?  ?Neuro/Psych ? Headaches, negative psych ROS  ? GI/Hepatic ?negative GI ROS, Neg liver ROS,   ?Endo/Other  ?negative endocrine ROS ? Renal/GU ?negative Renal ROS  ? ?  ?Musculoskeletal ?negative musculoskeletal ROS ?(+)  ? Abdominal ?Normal abdominal exam  (+)   ?Peds ? Hematology ?negative hematology ROS ?(+)   ?Anesthesia Other Findings ? ? Reproductive/Obstetrics ? ?  ? ? ? ? ? ? ? ? ? ? ? ? ? ?  ?  ? ? ? ? ? ? ? ? ?Anesthesia Physical ?Anesthesia Plan ? ?ASA: 2 ? ?Anesthesia Plan: MAC  ? ?Post-op Pain Management:   ? ?Induction: Intravenous ? ?PONV Risk Score and Plan: 3 and Ondansetron, Propofol infusion and Midazolam ? ?Airway Management Planned: Natural Airway and Simple Face Mask ? ?Additional Equipment: None ? ?Intra-op Plan:  ? ?Post-operative Plan:  ? ?Informed Consent: I have reviewed the patients History and Physical, chart, labs and discussed the procedure including the risks, benefits and alternatives for the proposed anesthesia with the patient or authorized representative who has indicated his/her understanding and acceptance.  ? ? ? ? ? ?Plan Discussed with: CRNA ? ?Anesthesia Plan Comments:   ? ? ? ? ? ? ?Anesthesia Quick Evaluation ? ?

## 2022-01-17 NOTE — Discharge Instructions (Addendum)
ANORECTAL SURGERY: POST OP INSTRUCTIONS Take your usually prescribed home medications unless otherwise directed. DIET: During the first few hours after surgery sip on some liquids until you are able to urinate.  It is normal to not urinate for several hours after this surgery.  If you feel uncomfortable, please contact the office for instructions.  After you are able to urinate,you may eat, if you feel like it.  Follow a light bland diet the first 24 hours after arrival home, such as soup, liquids, crackers, etc.  Be sure to include lots of fluids daily (6-8 glasses).  Avoid fast food or heavy meals, as your are more likely to get nauseated.  Eat a low fat diet the next few days after surgery.  Limit caffeine intake to 1-2 servings a day. PAIN CONTROL: Pain is best controlled by a usual combination of several different methods TOGETHER: Muscle relaxation: Soak in a warm bath (or Sitz bath) three times a day and after bowel movements.  Continue to do this until all pain is resolved. Over the counter pain medication Prescription pain medication Most patients will experience some swelling and discomfort in the anus/rectal area and incisions.  Heat such as warm towels, sitz baths, warm baths, etc to help relax tight/sore spots and speed recovery.  Some people prefer to use ice, especially in the first couple days after surgery, as it may decrease the pain and swelling, or alternate between ice & heat.  Experiment to what works for you.  Swelling and bruising can take several weeks to resolve.  Pain can take even longer to completely resolve. It is helpful to take an over-the-counter pain medication regularly for the first few weeks.  Choose one of the following that works best for you: Naproxen (Aleve, etc)  Two 220mg tabs twice a day Ibuprofen (Advil, etc) Three 200mg tabs four times a day (every meal & bedtime) A  prescription for pain medication (such as percocet, oxycodone, hydrocodone, etc) should be  given to you upon discharge.  Take your pain medication as prescribed.  If you are having problems/concerns with the prescription medicine (does not control pain, nausea, vomiting, rash, itching, etc), please call us [phone] to see if we need to switch you to a different pain medicine that will work better for you and/or control your side effect better. If you need a refill on your pain medication, please contact your pharmacy.  They will contact our office to request authorization. Prescriptions will not be filled after 5 pm or on week-ends. KEEP YOUR BOWELS REGULAR and AVOID CONSTIPATION The goal is one to two soft bowel movements a day.  You should at least have a bowel movement every other day. Avoid getting constipated.  Between the surgery and the pain medications, it is common to experience some constipation. This can be very painful after rectal surgery.  Increasing fluid intake and taking a fiber supplement (such as Metamucil, Citrucel, FiberCon, etc) 1-2 times a day regularly will usually help prevent this problem from occurring.  A stool softener like colace is also recommended.  This can be purchased over the counter at your pharmacy.  You can take it up to 3 times a day.  If you do not have a bowel movement after 24 hrs since your surgery, take one does of milk of magnesia.  If you still haven't had a bowel movement 8-12 hours after that dose, take another dose.  If you don't have a bowel movement 48 hrs after surgery,   purchase a Fleets enema from the drug store and administer gently per package instructions.  If you still are having trouble with your bowel movements after that, please call the office for further instructions. If you develop diarrhea or have many loose bowel movements, simplify your diet to bland foods & liquids for a few days.  Stop any stool softeners and decrease your fiber supplement.  Switching to mild anti-diarrheal medications (Kayopectate, Pepto Bismol) can help.   If this worsens or does not improve, please call us.  Wound Care Remove your bandages before your first bowel movement or 8 hours after surgery.     Remove any wound packing material at this tim,e as well.  You do not need to repack the wound unless instructed otherwise.  Wear an absorbent pad or soft cotton gauze in your underwear to catch any drainage and help keep the area clean. You should change this every 2-3 hours while awake. Keep the area clean and dry.  Bathe / shower every day, especially after bowel movements.  Keep the area clean by showering / bathing over the incision / wound.   It is okay to soak an open wound to help wash it.  Wet wipes or showers / gentle washing after bowel movements is often less traumatic than regular toilet paper. You may have some styrofoam-like soft packing in the rectum which will come out with the first bowel movement.  You will often notice bleeding with bowel movements.  This should slow down by the end of the first week of surgery Expect some drainage.  This should slow down, too, by the end of the first week of surgery.  Wear an absorbent pad or soft cotton gauze in your underwear until the drainage stops. Do Not sit on a rubber or pillow ring.  This can make you symptoms worse.  You may sit on a soft pillow if needed.  ACTIVITIES as tolerated:   You may resume regular (light) daily activities beginning the next day--such as daily self-care, walking, climbing stairs--gradually increasing activities as tolerated.  If you can walk 30 minutes without difficulty, it is safe to try more intense activity such as jogging, treadmill, bicycling, low-impact aerobics, swimming, etc. Save the most intensive and strenuous activity for last such as sit-ups, heavy lifting, contact sports, etc  Refrain from any heavy lifting or straining until you are off narcotics for pain control.   You may drive when you are no longer taking prescription pain medication, you can  comfortably sit for long periods of time, and you can safely maneuver your car and apply brakes. You may have sexual intercourse when it is comfortable.  FOLLOW UP in our office Please call [facility] at [phone] to set up an appointment to see your surgeon in the office for a follow-up appointment approximately 3-4 weeks after your surgery. Make sure that you call for this appointment the day you arrive home to insure a convenient appointment time. 10. IF YOU HAVE DISABILITY OR FAMILY LEAVE FORMS, BRING THEM TO THE OFFICE FOR PROCESSING.  DO NOT GIVE THEM TO YOUR DOCTOR.     WHEN TO CALL US [phone]: Poor pain control Reactions / problems with new medications (rash/itching, nausea, etc)  Fever over 101.5 F (38.5 C) Inability to urinate Nausea and/or vomiting Worsening swelling or bruising Continued bleeding from incision. Increased pain, redness, or drainage from the incision  The clinic staff is available to answer your questions during regular business hours (8:30am-5pm).    Please dont hesitate to call and ask to speak to one of our nurses for clinical concerns.   A surgeon from [facility] is always on call at the hospitals   If you have a medical emergency, go to the nearest emergency room or call 911.    [facility] [address], [place], [place]  [address] ? MAIN: [phone] ? TOLL FREE: [phone] ? FAX [phone] [email or web address]   Post Anesthesia Home Care Instructions  Activity: Get plenty of rest for the remainder of the day. A responsible individual must stay with you for 24 hours following the procedure.  For the next 24 hours, DO NOT: -Drive a car -Advertising copywriter -Drink alcoholic beverages -Take any medication unless instructed by your physician -Make any legal decisions or sign important papers.  Meals: Start with liquid foods such as gelatin or soup. Progress to regular foods  as tolerated. Avoid greasy, spicy, heavy foods. If nausea and/or vomiting occur, drink only clear liquids until the nausea and/or vomiting subsides. Call your physician if vomiting continues.  Special Instructions/Symptoms: Your throat may feel dry or sore from the anesthesia or the breathing tube placed in your throat during surgery. If this causes discomfort, gargle with warm salt water. The discomfort should disappear within 24 hours.     Information for Discharge Teaching: EXPAREL (bupivacaine liposome injectable suspension)   Your surgeon or anesthesiologist gave you EXPAREL(bupivacaine) to help control your pain after surgery.  EXPAREL is a local anesthetic that provides pain relief by numbing the tissue around the surgical site. EXPAREL is designed to release pain medication over time and can control pain for up to 72 hours. Depending on how you respond to EXPAREL, you may require less pain medication during your recovery.  Possible side effects: Temporary loss of sensation or ability to move in the area where bupivacaine was injected. Nausea, vomiting, constipation Rarely, numbness and tingling in your mouth or lips, lightheadedness, or anxiety may occur. Call your doctor right away if you think you may be experiencing any of these sensations, or if you have other questions regarding possible side effects.  Follow all other discharge instructions given to you by your surgeon or nurse. Eat a healthy diet and drink plenty of water or other fluids.  If you return to the hospital for any reason within 96 hours following the administration of EXPAREL, it is important for health care providers to know that you have received this anesthetic. A teal colored band has been placed on your arm with the date, time and amount of EXPAREL you have received in order to alert and inform your health care providers. Please leave this armband in place for the full 96 hours following administration, and then  you may remove the band. (May remove Monday, March 6th)

## 2022-01-18 ENCOUNTER — Encounter (HOSPITAL_BASED_OUTPATIENT_CLINIC_OR_DEPARTMENT_OTHER): Payer: Self-pay | Admitting: General Surgery

## 2022-01-18 LAB — SURGICAL PATHOLOGY

## 2022-03-13 ENCOUNTER — Ambulatory Visit: Payer: 59 | Admitting: Gastroenterology

## 2022-11-25 ENCOUNTER — Other Ambulatory Visit: Payer: Self-pay

## 2022-11-25 ENCOUNTER — Emergency Department (HOSPITAL_COMMUNITY): Payer: 59

## 2022-11-25 ENCOUNTER — Encounter (HOSPITAL_COMMUNITY): Payer: Self-pay | Admitting: Emergency Medicine

## 2022-11-25 ENCOUNTER — Emergency Department (HOSPITAL_COMMUNITY)
Admission: EM | Admit: 2022-11-25 | Discharge: 2022-11-25 | Disposition: A | Discharge status: Home / Self Care | Payer: 59 | Attending: Emergency Medicine | Admitting: Emergency Medicine

## 2022-11-25 DIAGNOSIS — K5732 Diverticulitis of large intestine without perforation or abscess without bleeding: Secondary | ICD-10-CM | POA: Diagnosis not present

## 2022-11-25 DIAGNOSIS — Z20822 Contact with and (suspected) exposure to covid-19: Secondary | ICD-10-CM | POA: Diagnosis not present

## 2022-11-25 DIAGNOSIS — R109 Unspecified abdominal pain: Secondary | ICD-10-CM | POA: Diagnosis present

## 2022-11-25 LAB — COMPREHENSIVE METABOLIC PANEL
ALT: 13 U/L (ref 0–44)
AST: 18 U/L (ref 15–41)
Albumin: 4.4 g/dL (ref 3.5–5.0)
Alkaline Phosphatase: 57 U/L (ref 38–126)
Anion gap: 8 (ref 5–15)
BUN: 12 mg/dL (ref 6–20)
CO2: 27 mmol/L (ref 22–32)
Calcium: 8.9 mg/dL (ref 8.9–10.3)
Chloride: 101 mmol/L (ref 98–111)
Creatinine, Ser: 0.91 mg/dL (ref 0.44–1.00)
GFR, Estimated: >60 mL/min (ref >60)
Glucose, Bld: 114 mg/dL — ABNORMAL HIGH (ref 70–99)
Potassium: 3.4 mmol/L — ABNORMAL LOW (ref 3.5–5.1)
Sodium: 136 mmol/L (ref 135–145)
Total Bilirubin: 1 mg/dL (ref 0.3–1.2)
Total Protein: 7.4 g/dL (ref 6.5–8.1)

## 2022-11-25 LAB — URINALYSIS, ROUTINE W REFLEX MICROSCOPIC
Bilirubin Urine: NEGATIVE
Glucose, UA: NEGATIVE mg/dL
Ketones, ur: 5 mg/dL — AB
Leukocytes,Ua: NEGATIVE
Nitrite: NEGATIVE
Protein, ur: NEGATIVE mg/dL
Specific Gravity, Urine: 1.016 (ref 1.005–1.030)
pH: 7 (ref 5.0–8.0)

## 2022-11-25 LAB — RESP PANEL BY RT-PCR (RSV, FLU A&B, COVID)  RVPGX2
Influenza A by PCR: NEGATIVE
Influenza B by PCR: NEGATIVE
Resp Syncytial Virus by PCR: NEGATIVE
SARS Coronavirus 2 by RT PCR: NEGATIVE

## 2022-11-25 LAB — CBC
HCT: 43.1 % (ref 36.0–46.0)
Hemoglobin: 13.8 g/dL (ref 12.0–15.0)
MCH: 30.1 pg (ref 26.0–34.0)
MCHC: 32 g/dL (ref 30.0–36.0)
MCV: 94.1 fL (ref 80.0–100.0)
Platelets: 249 10*3/uL (ref 150–400)
RBC: 4.58 MIL/uL (ref 3.87–5.11)
RDW: 13.2 % (ref 11.5–15.5)
WBC: 16 10*3/uL — ABNORMAL HIGH (ref 4.0–10.5)
nRBC: 0 % (ref 0.0–0.2)

## 2022-11-25 LAB — LIPASE, BLOOD: Lipase: 29 U/L (ref 11–51)

## 2022-11-25 MED ORDER — AMOXICILLIN-POT CLAVULANATE 875-125 MG PO TABS: 1.0000 | ORAL_TABLET | Freq: Two times a day (BID) | ORAL | 0 refills | Status: DC

## 2022-11-25 MED ORDER — IOHEXOL 300 MG/ML  SOLN
100.0000 mL | Freq: Once | INTRAMUSCULAR | Status: AC | PRN
  Administered 2022-11-25: 100 mL via INTRAVENOUS

## 2022-11-25 MED ORDER — SODIUM CHLORIDE 0.9 % IV BOLUS
1000.0000 mL | Freq: Once | INTRAVENOUS | Status: AC
  Administered 2022-11-25: 1000 mL via INTRAVENOUS

## 2022-11-25 MED ORDER — KETOROLAC TROMETHAMINE 15 MG/ML IJ SOLN
15.0000 mg | Freq: Once | INTRAMUSCULAR | Status: AC
  Administered 2022-11-25: 15 mg via INTRAVENOUS
  Filled 2022-11-25: qty 1

## 2022-11-25 NOTE — ED Triage Notes (Signed)
Cramping to abd x 2 days. Body aches today, nausea "little diarrhea", denies vomiting. Has kept water down today. Mm moist. Pt c/o tenderness rlq.

## 2022-11-25 NOTE — ED Provider Notes (Signed)
[facility] EMERGENCY DEPARTMENT Provider Note   CSN: [id number] Arrival date & time: 11/25/22  1453     History  Chief Complaint  Patient presents with   Abdominal Pain    Judy Chase is a 57 y.o. female.  Patient with no pertinent past medical history presents today with complaints of abdominal pain.  She states that same has been persistent for the past 2 days.  Pain is located in her lower abdominal area and feels like a cramping sensation.  She endorses some nausea and a few episodes of loose stools.  Denies any vomiting.  Denies any history of similar symptoms previously. Denies hematuria or dysuria. She is postmenopausal and denies any vaginal bleeding or discharge.  The history is provided by the patient. No language interpreter was used.  Abdominal Pain Associated symptoms: nausea        Home Medications Prior to Admission medications   Medication Sig Start Date End Date Taking? Authorizing Provider  acetaminophen (TYLENOL) 500 MG tablet Take 1,000 mg by mouth every 6 (six) hours as needed (pain.).    [provider]  Carboxymeth-Glyc-Polysorb PF (REFRESH OPTIVE MEGA-3) 0.5-1-0.5 % SOLN Place 1 drop into both eyes in the morning and at bedtime.    [provider]  COLLAGEN PO Take 1 Scoop by mouth in the morning.    [provider]  Garlic (GARLIQUE PO) Take 1 tablet by mouth in the morning.    [provider]  hydrochlorothiazide (HYDRODIURIL) 25 MG tablet Take 25 mg by mouth daily as needed (leg swelling).    [provider]  HYDROcodone-acetaminophen (NORCO/VICODIN) 5-325 MG tablet Take 1-2 tablets by mouth every 6 (six) hours as needed. 01/17/22   Romie Levee, MD  HYDROcodone-acetaminophen (NORCO/VICODIN) 5-325 MG tablet Take 1-2 tablets by mouth every 6 (six) hours as needed. 01/17/22   Romie Levee, MD  ibuprofen (ADVIL) 200 MG tablet Take 400 mg by mouth every 8 (eight) hours as needed (for pain.).    [provider]  METAMUCIL FIBER PO Take 28.3 g by mouth at bedtime. 2 tablespoons    [provider]  metroNIDAZOLE (METROGEL) 1 % gel Apply 1 application topically at bedtime. To face 05/27/21   [provider]  Omega-3 Fatty Acids (OMEGA 3 PO) Take 2,000 mg by mouth in the morning.    [provider]  TURMERIC PO Take 1,000 mg by mouth in the morning.    [provider]      Allergies    Naproxen and Doxycycline    Review of Systems   Review of Systems  Gastrointestinal:  Positive for abdominal pain and nausea.  All other systems reviewed and are negative.   Physical Exam Updated Vital Signs BP (!) 143/75 (BP Location: Right Arm)   Pulse 75   Temp 99.9 F (37.7 C) (Oral)   Resp 18   SpO2 99%  Physical Exam Vitals and nursing note reviewed.  Constitutional:      General: She is not in acute distress.    Appearance: Normal appearance. She is normal weight. She is not ill-appearing, toxic-appearing or diaphoretic.  HENT:     Head: Normocephalic and atraumatic.  Cardiovascular:     Rate and Rhythm: Normal rate.  Pulmonary:     Effort: Pulmonary effort is normal. No respiratory distress.  Abdominal:     General: Abdomen is flat.     Palpations: Abdomen is soft.     Tenderness: There is abdominal tenderness  in the right lower quadrant, suprapubic area and left lower quadrant.  Musculoskeletal:        General: Normal range of motion.     Cervical back: Normal range of motion.  Skin:    General: Skin is warm and dry.  Neurological:     General: No focal deficit present.     Mental Status: She is alert.  Psychiatric:        Mood and Affect: Mood normal.        Behavior: Behavior normal.     ED Results / Procedures / Treatments   Labs (all labs ordered are listed, but only abnormal results are displayed) Labs Reviewed  COMPREHENSIVE METABOLIC PANEL - Abnormal; Notable for the following components:      Result Value   Potassium 3.4  (*)    Glucose, Bld 114 (*)    All other components within normal limits  CBC - Abnormal; Notable for the following components:   WBC 16.0 (*)    All other components within normal limits  URINALYSIS, ROUTINE W REFLEX MICROSCOPIC - Abnormal; Notable for the following components:   Hgb urine dipstick SMALL (*)    Ketones, ur 5 (*)    Bacteria, UA RARE (*)    All other components within normal limits  RESP PANEL BY RT-PCR (RSV, FLU A&B, COVID)  RVPGX2  LIPASE, BLOOD    EKG None  Radiology CT ABDOMEN PELVIS W CONTRAST  Result Date: 11/25/2022 CLINICAL DATA:  Abdominal pain, acute, nonlocalized. Nausea, diarrhea EXAM: CT ABDOMEN AND PELVIS WITH CONTRAST TECHNIQUE: Multidetector CT imaging of the abdomen and pelvis was performed using the standard protocol following bolus administration of intravenous contrast. RADIATION DOSE REDUCTION: This exam was performed according to the departmental dose-optimization program which includes automated exposure control, adjustment of the mA and/or kV according to patient size and/or use of iterative reconstruction technique. CONTRAST:  144mL OMNIPAQUE IOHEXOL 300 MG/ML  SOLN COMPARISON:  None Available. FINDINGS: Lower chest: No acute abnormality. Bilateral breast implants partially visualized. Hepatobiliary: No focal liver abnormality is seen. Status post cholecystectomy. No biliary dilatation. Pancreas: Unremarkable Spleen: Unremarkable Adrenals/Urinary Tract: Adrenal glands are unremarkable. Kidneys are normal, without renal calculi, focal lesion, or hydronephrosis. Bladder is unremarkable. Stomach/Bowel: There is circumferential bowel wall thickening and extensive pericolonic inflammatory stranding involving the mid sigmoid colon in keeping with changes of acute sigmoid diverticulitis. There is background moderate sigmoid diverticulosis. Extensive inflammatory changes also involved the adjacent high rectum which is likely inflamed by the adjacent inflammatory  process within the sigmoid colon. Moderate perirectal and presacral edema. There is, however, no evidence of obstruction. No free intraperitoneal gas or fluid. No loculated abdominal fluid collections. The stomach, small bowel, and large bowel are otherwise unremarkable. Appendix normal. Vascular/Lymphatic: No significant vascular findings are present. No enlarged abdominal or pelvic lymph nodes. Reproductive: Status post hysterectomy. No adnexal masses. Other: No abdominal wall hernia. Musculoskeletal: No acute bone abnormality. No lytic or blastic bone lesion. IMPRESSION: 1. Acute sigmoid diverticulitis. Extensive inflammatory changes also involve the adjacent high rectum which is likely inflamed by the adjacent sigmoid colon. No evidence of obstruction or perforation. Electronically Signed   By: Fidela Salisbury M.D.   On: 11/25/2022 20:43    Procedures Procedures    Medications Ordered in ED Medications  sodium chloride 0.9 % bolus 1,000 mL (1,000 mLs Intravenous New Bag/Given 11/25/22 2008)  ketorolac (TORADOL) 15 MG/ML injection 15 mg (15 mg Intravenous Given 11/25/22 2010)  iohexol (OMNIPAQUE) 300 MG/ML solution  100 mL (100 mLs Intravenous Contrast Given 11/25/22 2026)    ED Course/ Medical Decision Making/ A&P                           Medical Decision Making Amount and/or Complexity of Data Reviewed Labs: ordered. Radiology: ordered.  Risk Prescription drug management.   This patient is a 57 y.o. female who presents to the ED for concern of abdominal pain, this involves an extensive number of treatment options, and is a complaint that carries with it a high risk of complications and morbidity. The emergent differential diagnosis prior to evaluation includes, but is not limited to AAA, gastroenteritis, appendicitis, Bowel obstruction, Bowel perforation. Gastroparesis, DKA, Hernia, Inflammatory bowel disease, mesenteric ischemia, pancreatitis, peritonitis SBP, volvulus.  This is not an  exhaustive differential.   Past Medical History / Co-morbidities / Social History: none  Physical Exam: Physical exam performed. The pertinent findings include: Lower abdominal tenderness to palpation  Lab Tests: I ordered, and personally interpreted labs.  The pertinent results include:  K 3.4, WBC 16, UA noninfectious   Imaging Studies: I ordered imaging studies including CT abdomen pelvis with contrast. I independently visualized and interpreted imaging which showed   1. Acute sigmoid diverticulitis. Extensive inflammatory changes also involve the adjacent high rectum which is likely inflamed by the adjacent sigmoid colon. No evidence of obstruction or perforation.  I agree with the radiologist interpretation.    Medications: I ordered medication including fluids, toradol  for pain and dehydration. Reevaluation of the patient after these medicines showed that the patient improved. I have reviewed the patients home medicines and have made adjustments as needed.    Disposition:  Patient presents today with complaints of abdominal pain x 2 days.  She is afebrile, nontoxic-appearing, and in no acute distress with reassuring vital signs. Patient is nontoxic, nonseptic appearing, in no apparent distress.  Patient's pain and other symptoms adequately managed in emergency department.  Fluid bolus given.  Labs, imaging and vitals reviewed.  Patient does not meet the SIRS or Sepsis criteria.  On repeat exam patient does not have a surgical abdomin and there are no peritoneal signs.  No indication of appendicitis, bowel obstruction, bowel perforation, cholecystitis, PID or ectopic pregnancy.  CT imaging reveals diverticulitis without perforation.  Patient's presentation is consistent with same.  Patient discharged home with Augmentin for management of this diagnosis.  I have also discussed appropriate dietary regimen with this diagnosis.  Additional packet regarding this has been given in  discharge paperwork as well.  Patient given strict instructions for follow-up with their primary care physician.  I have also discussed reasons to return immediately to the ER.  Patient expresses understanding and agrees with plan.  Patient discharged in stable condition.   Final Clinical Impression(s) / ED Diagnoses Final diagnoses:  Diverticulitis large intestine w/o perforation or abscess w/o bleeding    Rx / DC Orders ED Discharge Orders          Ordered    amoxicillin-clavulanate (AUGMENTIN) 875-125 MG tablet  Every 12 hours        11/25/22 2119          An After Visit Summary was printed and given to the patient.     Nestor Lewandowsky 11/25/22 2121    Davonna Belling, MD 11/25/22 [phone]

## 2022-11-25 NOTE — Discharge Instructions (Addendum)
As we discussed, CT imaging of your abdomen revealed that you have diverticulitis.  I have given you a prescription for an antibiotic for you to take in its entirety for management of this diagnosis.  I also recommend that you avoid high fiber foods for the first 24-48 hours to rest your bowels. You may take Tylenol/ibuprofen as needed for pain.  Follow-up with your primary care doctor for continued evaluation and management of this diagnosis.  Return if development of any new or worsening symptoms.

## 2023-02-07 ENCOUNTER — Other Ambulatory Visit: Payer: Self-pay | Admitting: Family Medicine

## 2023-02-07 DIAGNOSIS — Z1231 Encounter for screening mammogram for malignant neoplasm of breast: Secondary | ICD-10-CM

## 2023-04-02 ENCOUNTER — Ambulatory Visit
Admission: RE | Admit: 2023-04-02 | Discharge: 2023-04-02 | Disposition: A | Discharge status: Home / Self Care | Payer: 59 | Source: Ambulatory Visit | Attending: Family Medicine | Admitting: Family Medicine

## 2023-04-02 DIAGNOSIS — Z1231 Encounter for screening mammogram for malignant neoplasm of breast: Secondary | ICD-10-CM

## 2024-01-20 ENCOUNTER — Other Ambulatory Visit: Payer: Self-pay | Admitting: Family Medicine

## 2024-01-20 DIAGNOSIS — Z1231 Encounter for screening mammogram for malignant neoplasm of breast: Secondary | ICD-10-CM

## 2024-04-06 ENCOUNTER — Ambulatory Visit
Admission: RE | Admit: 2024-04-06 | Discharge: 2024-04-06 | Disposition: A | Discharge status: Home / Self Care | Source: Ambulatory Visit | Attending: Family Medicine | Admitting: Family Medicine

## 2024-04-06 DIAGNOSIS — Z1231 Encounter for screening mammogram for malignant neoplasm of breast: Secondary | ICD-10-CM

## 2024-06-29 ENCOUNTER — Encounter: Payer: Self-pay | Admitting: Physician Assistant

## 2024-06-29 ENCOUNTER — Ambulatory Visit: Admitting: Physician Assistant

## 2024-06-29 VITALS — BP 122/78 | HR 54 | Temp 97.3°F | Ht 67.0 in | Wt 164.4 lb

## 2024-06-29 DIAGNOSIS — E782 Mixed hyperlipidemia: Secondary | ICD-10-CM

## 2024-06-29 DIAGNOSIS — Z7689 Persons encountering health services in other specified circumstances: Secondary | ICD-10-CM

## 2024-06-29 MED ORDER — ATORVASTATIN CALCIUM 20 MG PO TABS: 20.0000 mg | ORAL_TABLET | Freq: Every day | ORAL | 3 refills | Status: AC

## 2024-06-29 MED ORDER — ESTRADIOL 0.1 MG/24HR TD PTTW: 1.0000 | MEDICATED_PATCH | TRANSDERMAL | 12 refills | Status: AC

## 2024-06-29 NOTE — Progress Notes (Signed)
 New Patient Office Visit  Subjective    Patient ID: Judy Chase, female    DOB: 10-15-1966  Age: 58 y.o. MRN: [id number]  CC: No chief complaint on file.   HPI Judy Chase presents to establish care  Patient presents today with pm significant for hyperlipidemia and rosacea. She reports she follows regularly with dermatology. She denies concerns or complaints today aside from establishing care. She is up to date on mammogram and colonoscopy, she has had a total hysterectomy and no longer requires Paps. She is over due for tetanus, pneumonia, and shingles vaccinations.   Outpatient Encounter Medications as of 06/29/2024  Medication Sig   [DISCONTINUED] atorvastatin  (LIPITOR) 20 MG tablet Take 20 mg by mouth daily.   [DISCONTINUED] estradiol  (VIVELLE -DOT) 0.1 MG/24HR patch Place 1 patch onto the skin 2 (two) times a week.   acetaminophen  (TYLENOL ) 500 MG tablet Take 1,000 mg by mouth every 6 (six) hours as needed (pain.).   atorvastatin  (LIPITOR) 20 MG tablet Take 1 tablet (20 mg total) by mouth daily.   Carboxymeth-Glyc-Polysorb PF (REFRESH OPTIVE MEGA-3) 0.5-1-0.5 % SOLN Place 1 drop into both eyes in the morning and at bedtime.   COLLAGEN PO Take 1 Scoop by mouth in the morning.   [START ON 07/01/2024] estradiol  (VIVELLE -DOT) 0.1 MG/24HR patch Place 1 patch (0.1 mg total) onto the skin 2 (two) times a week.   hydrochlorothiazide (HYDRODIURIL) 25 MG tablet Take 25 mg by mouth daily as needed (leg swelling).   ibuprofen (ADVIL) 200 MG tablet Take 400 mg by mouth every 8 (eight) hours as needed (for pain.).   METAMUCIL FIBER PO Take 28.3 g by mouth at bedtime. 2 tablespoons   metroNIDAZOLE (METROGEL) 1 % gel Apply 1 application topically at bedtime. To face   Omega-3 Fatty Acids (OMEGA 3 PO) Take 2,000 mg by mouth in the morning.   TURMERIC PO Take 1,000 mg by mouth in the morning.   [DISCONTINUED] amoxicillin -clavulanate (AUGMENTIN ) 875-125 MG tablet Take 1 tablet by mouth every 12  (twelve) hours.   [DISCONTINUED] Garlic (GARLIQUE PO) Take 1 tablet by mouth in the morning. (Patient not taking: Reported on 06/29/2024)   [DISCONTINUED] HYDROcodone -acetaminophen  (NORCO/VICODIN) 5-325 MG tablet Take 1-2 tablets by mouth every 6 (six) hours as needed.   [DISCONTINUED] HYDROcodone -acetaminophen  (NORCO/VICODIN) 5-325 MG tablet Take 1-2 tablets by mouth every 6 (six) hours as needed.   No facility-administered encounter medications on file as of 06/29/2024.    Past Medical History:  Diagnosis Date   Dry eye syndrome of both eyes    History of migraine    none since stopping splenda in 2012   Rosacea    Wears glasses for reading     Past Surgical History:  Procedure Laterality Date   ABDOMINOPLASTY     yrs ago   AUGMENTATION MAMMAPLASTY Bilateral 2006   AUGMENTATION MAMMAPLASTY Bilateral 2020   LIFT   BREAST BIOPSY     yrs ago   BREAST ENHANCEMENT SURGERY Bilateral 2020   lift  of breasts   CESAREAN SECTION  1985   COLONOSCOPY WITH PROPOFOL  N/A 10/16/2021   Procedure: COLONOSCOPY WITH PROPOFOL ;  Surgeon: Cindie Carlin POUR, DO;  [facility];  Service: Endoscopy;  Laterality: N/A;  7:30am   eye lid lift surgery both eyelids 2020     hemorrhoid banding x 4     nov & dec 2022 done in gi office   HEMORRHOID SURGERY N/A 06/04/2013   Procedure: HEMORRHOIDECTOMY;  Surgeon: Thresa  JAYSON Pulling, MD;  Location: [facility];  Service: General;  Laterality: N/A;   HEMORRHOID SURGERY N/A 01/17/2022   Procedure: SINGLE COLUMN HEMORRHOIDECTOMY;  Surgeon: Debby Hila, MD;  Location: [facility] [place];  Service: General;  Laterality: N/A;   LAPAROSCOPIC CHOLECYSTECTOMY     yrs ago   radial ketatotomy Bilateral    both eyes yrs ago   TOTAL ABDOMINAL HYSTERECTOMY     yrs ago    Family History  Problem Relation Age of Onset   Diverticulosis Mother    Diabetes Father    Arthritis Sister    Lung cancer Brother    Mental illness Brother    Colon cancer Neg Hx     Colon polyps Neg Hx    BRCA 1/2 Neg Hx    Breast cancer Neg Hx     Social History   Socioeconomic History   Marital status: Married    Spouse name: Not on file   Number of children: Not on file   Years of education: Not on file   Highest education level: Not on file  Occupational History   Not on file  Tobacco Use   Smoking status: Never   Smokeless tobacco: Never  Vaping Use   Vaping status: Never Used  Substance and Sexual Activity   Alcohol use: Yes    Alcohol/week: 0.0 standard drinks of alcohol    Comment: occassional   Drug use: Never   Sexual activity: Yes    Birth control/protection: Surgical  Other Topics Concern   Not on file  Social History Narrative   Not on file   Social Drivers of Health   Financial Resource Strain: Not on file  Food Insecurity: Not on file  Transportation Needs: Not on file  Physical Activity: Not on file  Stress: Not on file  Social Connections: Not on file  Intimate Partner Violence: Not on file    Review of Systems  Constitutional:  Negative for chills, fever and malaise/fatigue.  Eyes:  Negative for blurred vision and double vision.  Respiratory:  Negative for cough and shortness of breath.   Cardiovascular:  Negative for chest pain and palpitations.  Musculoskeletal:  Negative for joint pain and myalgias.  Neurological:  Negative for dizziness and headaches.  Psychiatric/Behavioral:  Negative for depression. The patient is not nervous/anxious.         Objective    BP 122/78   Pulse (!) 54   Temp (!) 97.3 F (36.3 C)   Ht 5' 7 (1.702 m)   Wt 164 lb 6.4 oz (74.6 kg)   SpO2 97%   BMI 25.75 kg/m   Physical Exam Constitutional:      Appearance: Normal appearance.  HENT:     Head: Normocephalic and atraumatic.     Mouth/Throat:     Mouth: Mucous membranes are moist.     Pharynx: Oropharynx is clear.  Eyes:     Extraocular Movements: Extraocular movements intact.     Conjunctiva/sclera: Conjunctivae  normal.  Cardiovascular:     Rate and Rhythm: Normal rate and regular rhythm.     Heart sounds: Normal heart sounds. No murmur heard. Pulmonary:     Effort: Pulmonary effort is normal.     Breath sounds: Normal breath sounds. No wheezing or rales.  Skin:    General: Skin is warm and dry.  Neurological:     General: No focal deficit present.     Mental Status: She is alert and oriented to person, place,  and time.  Psychiatric:        Mood and Affect: Mood normal.        Behavior: Behavior normal.        Assessment & Plan:  Encounter to establish care  Mixed hyperlipidemia Assessment & Plan: Stable. Continue with current management without changes. Discussed healthy diet and lifestyle. Medication refilled today.   Orders: -     Atorvastatin  Calcium ; Take 1 tablet (20 mg total) by mouth daily.  Dispense: 90 tablet; Refill: 3  Other orders -     Estradiol ; Place 1 patch (0.1 mg total) onto the skin 2 (two) times a week.  Dispense: 8 patch; Refill: 12    Return in about 7 months (around 01/27/2025) for phys .   Charmaine Denielle Bayard, PA-C

## 2024-06-29 NOTE — Assessment & Plan Note (Signed)
 Stable. Continue with current management without changes. Discussed healthy diet and lifestyle. Medication refilled today.

## 2024-10-12 ENCOUNTER — Encounter: Payer: Self-pay | Admitting: Podiatry

## 2024-10-12 ENCOUNTER — Ambulatory Visit: Admitting: Podiatry

## 2024-10-12 ENCOUNTER — Ambulatory Visit (INDEPENDENT_AMBULATORY_CARE_PROVIDER_SITE_OTHER)

## 2024-10-12 VITALS — Ht 67.0 in | Wt 164.4 lb

## 2024-10-12 DIAGNOSIS — M7752 Other enthesopathy of left foot: Secondary | ICD-10-CM

## 2024-10-12 DIAGNOSIS — M7751 Other enthesopathy of right foot: Secondary | ICD-10-CM

## 2024-10-12 NOTE — Progress Notes (Signed)
 Chief Complaint  Patient presents with   Foot Pain    Pt is here due to pain in the right foot, she states her 4th and 5th toes has knots on them and are causing pain, this has been going on for about 3 years, lately the pain is more constant, states she has bought bigger and wider shoes with no relief. On the left foot she complains of 2nd and 3rd toes separating.    HPI: 58 y.o. female presenting today for above complaint  Past Medical History:  Diagnosis Date   Dry eye syndrome of both eyes    History of migraine    none since stopping splenda in 2012   Rosacea    Wears glasses for reading     Past Surgical History:  Procedure Laterality Date   ABDOMINOPLASTY     yrs ago   AUGMENTATION MAMMAPLASTY Bilateral 2006   AUGMENTATION MAMMAPLASTY Bilateral 2020   LIFT   BREAST BIOPSY     yrs ago   BREAST ENHANCEMENT SURGERY Bilateral 2020   lift  of breasts   CESAREAN SECTION  1985   COLONOSCOPY WITH PROPOFOL  N/A 10/16/2021   Procedure: COLONOSCOPY WITH PROPOFOL ;  Surgeon: Cindie Carlin POUR, DO;  [facility];  Service: Endoscopy;  Laterality: N/A;  7:30am   eye lid lift surgery both eyelids 2020     hemorrhoid banding x 4     nov & dec 2022 done in gi office   HEMORRHOID SURGERY N/A 06/04/2013   Procedure: HEMORRHOIDECTOMY;  Surgeon: Thresa JAYSON Pulling, MD;  Location: [facility];  Service: General;  Laterality: N/A;   HEMORRHOID SURGERY N/A 01/17/2022   Procedure: SINGLE COLUMN HEMORRHOIDECTOMY;  Surgeon: Debby Hila, MD;  Location: [facility] [place];  Service: General;  Laterality: N/A;   LAPAROSCOPIC CHOLECYSTECTOMY     yrs ago   radial ketatotomy Bilateral    both eyes yrs ago   TOTAL ABDOMINAL HYSTERECTOMY     yrs ago    Allergies  Allergen Reactions   Naproxen  Swelling    Body aches   Doxycycline Rash     Physical Exam: General: The patient is alert and oriented x3 in no acute distress.  Dermatology: Skin is warm, dry and supple bilateral  lower extremities.   Vascular: Palpable pedal pulses bilaterally. Capillary refill within normal limits.  No appreciable edema.  No erythema.  Neurological: Grossly intact via light touch  Musculoskeletal Exam: Associated tenderness to palpation overlying the PIPJ of the fifth digit right foot as well as the distal medial aspect of the right fifth toe pressing against the adjacent fourth toe.  Mild splay toe deformity between the 2nd and 3rd toes (Sullivan's sign) possibly secondary to development of a Morton's neuroma  Radiographic Exam B/L feet 10/12/2024:  Normal osseous mineralization. Joint spaces preserved.  No fractures or osseous irregularities noted.  Impression: Negative  Assessment/Plan of Care: 1.  Floretta sign/toe splay 2nd and 3rd digits left foot; possibly related to Morton's neuroma; asymptomatic 2.  Hammertoe contracture fifth digit right foot pressing against the adjacent fourth toe  -Patient evaluated.  X-rays reviewed -In regards to the splay toe deformity of the left foot, it is currently asymptomatic.  Idiopathic.  She has no symptoms associated to the left forefoot.  Recommend possible arch supports to offload pressure from the forefoot.  Simply observe since it is asymptomatic for the moment -Regarding the right foot, the patient has tried several different wide fitting shoes but continues  to have pain and tenderness.  She has also tried padding and Band-Aids to alleviate pressure between the 4th and 5th toes.  Unfortunately I believe that she has pursued multiple conservative treatment modalities without any improvement or relief. 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  We will continue conservative treatment for now -Silicone toe caps were provided to alleviate  pressure -She also wears closed toed compression socks.  Recommend open toed compression socks to see if this helps alleviate some of her pressure to the toes -Return to clinic PRN  *IV vascular access advocate goes to all of the [facility]       Thresa EMERSON Sar, DPM [facility]  Dr. Thresa EMERSON Sar, DPM    [address] [place], [place] [address]                Office [phone]  Fax [phone]

## 2024-10-21 ENCOUNTER — Other Ambulatory Visit: Payer: Self-pay | Admitting: Physician Assistant

## 2024-10-21 ENCOUNTER — Telehealth: Payer: Self-pay | Admitting: *Deleted

## 2024-10-21 DIAGNOSIS — Z1283 Encounter for screening for malignant neoplasm of skin: Secondary | ICD-10-CM

## 2024-10-21 NOTE — Telephone Encounter (Signed)
 Copied from CRM #[id number]. Topic: Referral - Request for Referral >> Oct 13, 2024 11:57 AM Joesph NOVAK wrote: Did the patient discuss referral with their provider in the last year? Yes (If No - schedule appointment) (If Yes - send message)  Basal cell cancer on back that's been removed, the one on her back still looks the same. Other areas that need to evaluated.  Moles.   Appointment offered? No  Type of order/referral and detailed reason for visit: Dermatology   Preference of office, provider, location: Duwaine Franklin Harms, MD, [facility] FAX:[phone]  If referral order, have you been seen by this specialty before? No (If Yes, this issue or another issue? When? Where?  Can we respond through MyChart? Yes

## 2025-02-28 ENCOUNTER — Encounter: Admitting: Physician Assistant
